# Patient Record
Sex: Female | Born: 1955 | Race: White | Hispanic: No | Marital: Married | Smoking: Former smoker
Health system: Southern US, Community
[De-identification: ages and names within clinical notes are randomized; demographics above are authoritative.]

## PROBLEM LIST (undated history)

## (undated) DIAGNOSIS — U071 COVID-19: Secondary | ICD-10-CM

## (undated) DIAGNOSIS — J1282 Pneumonia due to coronavirus disease 2019: Secondary | ICD-10-CM

## (undated) DIAGNOSIS — F329 Major depressive disorder, single episode, unspecified: Secondary | ICD-10-CM

## (undated) DIAGNOSIS — J189 Pneumonia, unspecified organism: Secondary | ICD-10-CM

## (undated) DIAGNOSIS — G9341 Metabolic encephalopathy: Secondary | ICD-10-CM

## (undated) DIAGNOSIS — F32A Depression, unspecified: Secondary | ICD-10-CM

## (undated) DIAGNOSIS — J9621 Acute and chronic respiratory failure with hypoxia: Secondary | ICD-10-CM

## (undated) HISTORY — PX: APPENDECTOMY: SHX54

## (undated) HISTORY — PX: TONSILLECTOMY: SUR1361

## (undated) HISTORY — PX: ABDOMINAL HYSTERECTOMY: SHX81

---

## 2013-04-19 ENCOUNTER — Emergency Department (HOSPITAL_BASED_OUTPATIENT_CLINIC_OR_DEPARTMENT_OTHER): Payer: BC Managed Care – PPO

## 2013-04-19 ENCOUNTER — Emergency Department (HOSPITAL_BASED_OUTPATIENT_CLINIC_OR_DEPARTMENT_OTHER)
Admission: EM | Admit: 2013-04-19 | Discharge: 2013-04-19 | Disposition: A | Discharge status: Home / Self Care | Payer: BC Managed Care – PPO | Attending: Emergency Medicine | Admitting: Emergency Medicine

## 2013-04-19 ENCOUNTER — Encounter (HOSPITAL_BASED_OUTPATIENT_CLINIC_OR_DEPARTMENT_OTHER): Payer: Self-pay | Admitting: Emergency Medicine

## 2013-04-19 DIAGNOSIS — F3289 Other specified depressive episodes: Secondary | ICD-10-CM | POA: Insufficient documentation

## 2013-04-19 DIAGNOSIS — F329 Major depressive disorder, single episode, unspecified: Secondary | ICD-10-CM | POA: Insufficient documentation

## 2013-04-19 DIAGNOSIS — R110 Nausea: Secondary | ICD-10-CM | POA: Insufficient documentation

## 2013-04-19 DIAGNOSIS — R0789 Other chest pain: Secondary | ICD-10-CM | POA: Insufficient documentation

## 2013-04-19 DIAGNOSIS — M79609 Pain in unspecified limb: Secondary | ICD-10-CM | POA: Insufficient documentation

## 2013-04-19 DIAGNOSIS — Z87891 Personal history of nicotine dependence: Secondary | ICD-10-CM | POA: Insufficient documentation

## 2013-04-19 DIAGNOSIS — Z79899 Other long term (current) drug therapy: Secondary | ICD-10-CM | POA: Insufficient documentation

## 2013-04-19 HISTORY — DX: Major depressive disorder, single episode, unspecified: F32.9

## 2013-04-19 HISTORY — DX: Depression, unspecified: F32.A

## 2013-04-19 LAB — TROPONIN I: Troponin I: <0.3 ng/mL (ref <0.30)

## 2013-04-19 LAB — CBC WITH DIFFERENTIAL/PLATELET
Basophils Absolute: 0 10*3/uL (ref 0.0–0.1)
Basophils Relative: 0 % (ref 0–1)
Eosinophils Absolute: 0.1 10*3/uL (ref 0.0–0.7)
Eosinophils Relative: 1 % (ref 0–5)
HCT: 43.6 % (ref 36.0–46.0)
HEMOGLOBIN: 14.5 g/dL (ref 12.0–15.0)
LYMPHS ABS: 1.9 10*3/uL (ref 0.7–4.0)
Lymphocytes Relative: 27 % (ref 12–46)
MCH: 30.8 pg (ref 26.0–34.0)
MCHC: 33.3 g/dL (ref 30.0–36.0)
MCV: 92.6 fL (ref 78.0–100.0)
MONOS PCT: 9 % (ref 3–12)
Monocytes Absolute: 0.6 10*3/uL (ref 0.1–1.0)
NEUTROS PCT: 63 % (ref 43–77)
Neutro Abs: 4.4 10*3/uL (ref 1.7–7.7)
Platelets: 200 10*3/uL (ref 150–400)
RBC: 4.71 MIL/uL (ref 3.87–5.11)
RDW: 13.6 % (ref 11.5–15.5)
WBC: 7 10*3/uL (ref 4.0–10.5)

## 2013-04-19 LAB — COMPREHENSIVE METABOLIC PANEL
ALK PHOS: 108 U/L (ref 39–117)
ALT: 49 U/L — ABNORMAL HIGH (ref 0–35)
AST: 35 U/L (ref 0–37)
Albumin: 4.3 g/dL (ref 3.5–5.2)
BILIRUBIN TOTAL: 0.3 mg/dL (ref 0.3–1.2)
BUN: 17 mg/dL (ref 6–23)
CHLORIDE: 97 meq/L (ref 96–112)
CO2: 27 meq/L (ref 19–32)
Calcium: 10.9 mg/dL — ABNORMAL HIGH (ref 8.4–10.5)
Creatinine, Ser: 0.8 mg/dL (ref 0.50–1.10)
GFR, EST NON AFRICAN AMERICAN: 80 mL/min — AB (ref >90)
GLUCOSE: 84 mg/dL (ref 70–99)
POTASSIUM: 3.9 meq/L (ref 3.7–5.3)
Sodium: 141 mEq/L (ref 137–147)
Total Protein: 7.9 g/dL (ref 6.0–8.3)

## 2013-04-19 MED ORDER — SODIUM CHLORIDE 0.9 % IV SOLN
INTRAVENOUS | Status: DC
  Administered 2013-04-19: 10:00:00 via INTRAVENOUS

## 2013-04-19 NOTE — Discharge Instructions (Signed)
Followup with your Dr. as already scheduled. Go to your nearest hospital should he develop chest pain with associated shortness of breath, sweating, or any other problems. Chest Pain (Nonspecific) It is often hard to give a specific diagnosis for the cause of chest pain. There is always a chance that your pain could be related to something serious, such as a heart attack or a blood clot in the lungs. You need to follow up with your caregiver for further evaluation. CAUSES   Heartburn.  Pneumonia or bronchitis.  Anxiety or stress.  Inflammation around your heart (pericarditis) or lung (pleuritis or pleurisy).  A blood clot in the lung.  A collapsed lung (pneumothorax). It can develop suddenly on its own (spontaneous pneumothorax) or from injury (trauma) to the chest.  Shingles infection (herpes zoster virus). The chest wall is composed of bones, muscles, and cartilage. Any of these can be the source of the pain.  The bones can be bruised by injury.  The muscles or cartilage can be strained by coughing or overwork.  The cartilage can be affected by inflammation and become sore (costochondritis). DIAGNOSIS  Lab tests or other studies, such as X-rays, electrocardiography, stress testing, or cardiac imaging, may be needed to find the cause of your pain.  TREATMENT   Treatment depends on what may be causing your chest pain. Treatment may include:  Acid blockers for heartburn.  Anti-inflammatory medicine.  Pain medicine for inflammatory conditions.  Antibiotics if an infection is present.  You may be advised to change lifestyle habits. This includes stopping smoking and avoiding alcohol, caffeine, and chocolate.  You may be advised to keep your head raised (elevated) when sleeping. This reduces the chance of acid going backward from your stomach into your esophagus.  Most of the time, nonspecific chest pain will improve within 2 to 3 days with rest and mild pain medicine. HOME  CARE INSTRUCTIONS   If antibiotics were prescribed, take your antibiotics as directed. Finish them even if you start to feel better.  For the next few days, avoid physical activities that bring on chest pain. Continue physical activities as directed.  Do not smoke.  Avoid drinking alcohol.  Only take over-the-counter or prescription medicine for pain, discomfort, or fever as directed by your caregiver.  Follow your caregiver's suggestions for further testing if your chest pain does not go away.  Keep any follow-up appointments you made. If you do not go to an appointment, you could develop lasting (chronic) problems with pain. If there is any problem keeping an appointment, you must call to reschedule. SEEK MEDICAL CARE IF:   You think you are having problems from the medicine you are taking. Read your medicine instructions carefully.  Your chest pain does not go away, even after treatment.  You develop a rash with blisters on your chest. SEEK IMMEDIATE MEDICAL CARE IF:   You have increased chest pain or pain that spreads to your arm, neck, jaw, back, or abdomen.  You develop shortness of breath, an increasing cough, or you are coughing up blood.  You have severe back or abdominal pain, feel nauseous, or vomit.  You develop severe weakness, fainting, or chills.  You have a fever. THIS IS AN EMERGENCY. Do not wait to see if the pain will go away. Get medical help at once. Call your local emergency services (911 in [place] Do not drive yourself to the hospital. MAKE SURE YOU:   Understand these instructions.  Will watch your condition.  Will  get help right away if you are not doing well or get worse. Document Released: 10/20/2004 Document Revised: 04/04/2011 Document Reviewed: 08/16/2007 [facility] Patient Information 2014 [place].

## 2013-04-19 NOTE — ED Notes (Signed)
MD at bedside discussing test results and dispo plan of care. 

## 2013-04-19 NOTE — ED Provider Notes (Signed)
CSN: [id number]     Arrival date & time 04/19/13  7829 History   First MD Initiated Contact with Patient 04/19/13 0957     Chief Complaint  Patient presents with  . Chest Pain     (Consider location/radiation/quality/duration/timing/severity/associated sxs/prior Treatment) Patient is a 58 y.o. female presenting with chest pain. The history is provided by the patient.  Chest Pain  patient here complaining of left arm pain that started one hour prior to arrival while she was at work sitting down. Pain radiates to left side of her chest. No associated shortness of breath or diaphoresis. Some nausea noted. No syncope or near syncope. No pleuritic symptoms. Symptoms lasted for approximately one hour and she is pain-free. She did take aspirin. No prior history of same. Does feel back to her baseline at this time.  Past Medical History  Diagnosis Date  . Depression    Past Surgical History  Procedure Laterality Date  . Abdominal hysterectomy    . Appendectomy    . Tonsillectomy     No family history on file. History  Substance Use Topics  . Smoking status: Former Smoker    Quit date: 03/23/2011  . Smokeless tobacco: Not on file  . Alcohol Use: Yes     Comment: occasional   OB History   Grav Para Term Preterm Abortions TAB SAB Ect Mult Living                 Review of Systems  Cardiovascular: Positive for chest pain.  All other systems reviewed and are negative.      Allergies  Review of patient's allergies indicates no known allergies.  Home Medications   Current Outpatient Rx  Name  Route  Sig  Dispense  Refill  . escitalopram (LEXAPRO) 20 MG tablet   Oral   Take 20 mg by mouth daily.          BP 166/88  Pulse 74  Temp(Src) 98.2 F (36.8 C) (Oral)  Resp 18  Ht 5\' 2"  (1.575 m)  Wt 180 lb (81.647 kg)  BMI 32.91 kg/m2  SpO2 97% Physical Exam  Nursing note and vitals reviewed. Constitutional: She is oriented to person, place, and time. She appears  well-developed and well-nourished.  Non-toxic appearance. No distress.  HENT:  Head: Normocephalic and atraumatic.  Eyes: Conjunctivae, EOM and lids are normal. Pupils are equal, round, and reactive to light.  Neck: Normal range of motion. Neck supple. No tracheal deviation present. No mass present.  Cardiovascular: Normal rate, regular rhythm and normal heart sounds.  Exam reveals no gallop.   No murmur heard. Pulmonary/Chest: Effort normal and breath sounds normal. No stridor. No respiratory distress. She has no decreased breath sounds. She has no wheezes. She has no rhonchi. She has no rales.  Abdominal: Soft. Normal appearance and bowel sounds are normal. She exhibits no distension. There is no tenderness. There is no rebound and no CVA tenderness.  Musculoskeletal: Normal range of motion. She exhibits no edema and no tenderness.  Neurological: She is alert and oriented to person, place, and time. She has normal strength. No cranial nerve deficit or sensory deficit. GCS eye subscore is 4. GCS verbal subscore is 5. GCS motor subscore is 6.  Skin: Skin is warm and dry. No abrasion and no rash noted.  Psychiatric: She has a normal mood and affect. Her speech is normal and behavior is normal.    ED Course  Procedures (including critical care time) Labs Review Labs  Reviewed  CBC WITH DIFFERENTIAL  COMPREHENSIVE METABOLIC PANEL  TROPONIN I  TROPONIN I   Imaging Review No results found.   EKG Interpretation   Date/Time:  Friday April 19 2013 09:58:53 EDT Ventricular Rate:  78 PR Interval:  164 QRS Duration: 94 QT Interval:  398 QTC Calculation: 453 R Axis:   78 Text Interpretation:  Normal sinus rhythm Normal ECG Confirmed by Norene Oliveri   MD, Tanecia Mccay ([id number]) on 04/19/2013 10:15:34 AM      MDM   Final diagnoses:  None    Patient negative cardiac markers x2. Given the atypical nature of her symptoms I do not think that this represents ACS. She had no associated symptoms such as  dyspnea or diaphoresis. Patient called the primary care Dr. and was given strict return precautions.    Toy BakerAnthony T Shalva Rozycki, MD 04/19/13 [phone]

## 2013-04-19 NOTE — ED Notes (Signed)
Pt reports left chest wall pain radiating to left arm that started PTA while at work. She took 650mg  ASA PTA.

## 2019-10-03 ENCOUNTER — Inpatient Hospital Stay
Admission: AD | Admit: 2019-10-03 | Discharge: 2019-11-01 | Disposition: A | Discharge status: Rehabilitation Facility | Payer: 59 | Source: Other Acute Inpatient Hospital | Attending: Internal Medicine | Admitting: Internal Medicine

## 2019-10-03 ENCOUNTER — Other Ambulatory Visit (HOSPITAL_COMMUNITY): Payer: 59

## 2019-10-03 ENCOUNTER — Ambulatory Visit (HOSPITAL_COMMUNITY)
Admission: AD | Admit: 2019-10-03 | Discharge: 2019-10-03 | Disposition: A | Discharge status: Home / Self Care | Payer: Self-pay | Source: Other Acute Inpatient Hospital | Attending: Internal Medicine | Admitting: Internal Medicine

## 2019-10-03 DIAGNOSIS — J9621 Acute and chronic respiratory failure with hypoxia: Secondary | ICD-10-CM | POA: Diagnosis present

## 2019-10-03 DIAGNOSIS — U071 COVID-19: Secondary | ICD-10-CM | POA: Diagnosis present

## 2019-10-03 DIAGNOSIS — Z931 Gastrostomy status: Secondary | ICD-10-CM

## 2019-10-03 DIAGNOSIS — J189 Pneumonia, unspecified organism: Secondary | ICD-10-CM

## 2019-10-03 DIAGNOSIS — J1282 Pneumonia due to coronavirus disease 2019: Secondary | ICD-10-CM | POA: Diagnosis present

## 2019-10-03 DIAGNOSIS — J9690 Respiratory failure, unspecified, unspecified whether with hypoxia or hypercapnia: Secondary | ICD-10-CM | POA: Insufficient documentation

## 2019-10-03 DIAGNOSIS — G9341 Metabolic encephalopathy: Secondary | ICD-10-CM | POA: Diagnosis present

## 2019-10-03 DIAGNOSIS — Z930 Tracheostomy status: Secondary | ICD-10-CM

## 2019-10-03 HISTORY — DX: Pneumonia, unspecified organism: J18.9

## 2019-10-03 HISTORY — DX: COVID-19: U07.1

## 2019-10-03 HISTORY — DX: Pneumonia due to coronavirus disease 2019: J12.82

## 2019-10-03 HISTORY — DX: Acute and chronic respiratory failure with hypoxia: J96.21

## 2019-10-03 HISTORY — DX: Metabolic encephalopathy: G93.41

## 2019-10-03 LAB — BLOOD GAS, ARTERIAL
Acid-Base Excess: 4.1 mmol/L — ABNORMAL HIGH (ref 0.0–2.0)
Bicarbonate: 28.6 mmol/L — ABNORMAL HIGH (ref 20.0–28.0)
FIO2: 40
O2 Saturation: 99.5 %
Patient temperature: 37.9
pCO2 arterial: 49 mmHg — ABNORMAL HIGH (ref 32.0–48.0)
pH, Arterial: 7.388 (ref 7.350–7.450)
pO2, Arterial: 152 mmHg — ABNORMAL HIGH (ref 83.0–108.0)

## 2019-10-03 MED ORDER — IOHEXOL 300 MG/ML  SOLN
50.0000 mL | Freq: Once | INTRAMUSCULAR | Status: AC | PRN
  Administered 2019-10-03: 50 mL

## 2019-10-04 ENCOUNTER — Other Ambulatory Visit (HOSPITAL_COMMUNITY): Payer: 59

## 2019-10-04 ENCOUNTER — Encounter: Payer: Self-pay | Admitting: Internal Medicine

## 2019-10-04 DIAGNOSIS — J9621 Acute and chronic respiratory failure with hypoxia: Secondary | ICD-10-CM | POA: Diagnosis not present

## 2019-10-04 DIAGNOSIS — U071 COVID-19: Secondary | ICD-10-CM | POA: Diagnosis not present

## 2019-10-04 DIAGNOSIS — G9341 Metabolic encephalopathy: Secondary | ICD-10-CM | POA: Diagnosis not present

## 2019-10-04 DIAGNOSIS — J189 Pneumonia, unspecified organism: Secondary | ICD-10-CM | POA: Diagnosis not present

## 2019-10-04 DIAGNOSIS — J1282 Pneumonia due to coronavirus disease 2019: Secondary | ICD-10-CM

## 2019-10-04 LAB — BLOOD GAS, ARTERIAL
Acid-Base Excess: 5.3 mmol/L — ABNORMAL HIGH (ref 0.0–2.0)
Bicarbonate: 29.7 mmol/L — ABNORMAL HIGH (ref 20.0–28.0)
FIO2: 40
O2 Saturation: 99.4 %
Patient temperature: 36.6
pCO2 arterial: 45.9 mmHg (ref 32.0–48.0)
pH, Arterial: 7.424 (ref 7.350–7.450)
pO2, Arterial: 150 mmHg — ABNORMAL HIGH (ref 83.0–108.0)

## 2019-10-04 LAB — CBC WITH DIFFERENTIAL/PLATELET
Abs Immature Granulocytes: 0.24 10*3/uL — ABNORMAL HIGH (ref 0.00–0.07)
Basophils Absolute: 0 10*3/uL (ref 0.0–0.1)
Basophils Relative: 0 %
Eosinophils Absolute: 0.1 10*3/uL (ref 0.0–0.5)
Eosinophils Relative: 1 %
HCT: 27.7 % — ABNORMAL LOW (ref 36.0–46.0)
Hemoglobin: 8.2 g/dL — ABNORMAL LOW (ref 12.0–15.0)
Immature Granulocytes: 2 %
Lymphocytes Relative: 9 %
Lymphs Abs: 1 10*3/uL (ref 0.7–4.0)
MCH: 29.8 pg (ref 26.0–34.0)
MCHC: 29.6 g/dL — ABNORMAL LOW (ref 30.0–36.0)
MCV: 100.7 fL — ABNORMAL HIGH (ref 80.0–100.0)
Monocytes Absolute: 0.7 10*3/uL (ref 0.1–1.0)
Monocytes Relative: 6 %
Neutro Abs: 9.7 10*3/uL — ABNORMAL HIGH (ref 1.7–7.7)
Neutrophils Relative %: 82 %
Platelets: 296 10*3/uL (ref 150–400)
RBC: 2.75 MIL/uL — ABNORMAL LOW (ref 3.87–5.11)
RDW: 18.5 % — ABNORMAL HIGH (ref 11.5–15.5)
WBC: 11.7 10*3/uL — ABNORMAL HIGH (ref 4.0–10.5)
nRBC: 0.3 % — ABNORMAL HIGH (ref 0.0–0.2)

## 2019-10-04 LAB — COMPREHENSIVE METABOLIC PANEL
ALT: 134 U/L — ABNORMAL HIGH (ref 0–44)
AST: 72 U/L — ABNORMAL HIGH (ref 15–41)
Albumin: 2.6 g/dL — ABNORMAL LOW (ref 3.5–5.0)
Alkaline Phosphatase: 171 U/L — ABNORMAL HIGH (ref 38–126)
Anion gap: 11 (ref 5–15)
BUN: 73 mg/dL — ABNORMAL HIGH (ref 8–23)
CO2: 29 mmol/L (ref 22–32)
Calcium: 9.2 mg/dL (ref 8.9–10.3)
Chloride: 116 mmol/L — ABNORMAL HIGH (ref 98–111)
Creatinine, Ser: 0.88 mg/dL (ref 0.44–1.00)
GFR calc Af Amer: >60 mL/min (ref >60)
GFR calc non Af Amer: >60 mL/min (ref >60)
Glucose, Bld: 126 mg/dL — ABNORMAL HIGH (ref 70–99)
Potassium: 3.3 mmol/L — ABNORMAL LOW (ref 3.5–5.1)
Sodium: 156 mmol/L — ABNORMAL HIGH (ref 135–145)
Total Bilirubin: 0.6 mg/dL (ref 0.3–1.2)
Total Protein: 6.7 g/dL (ref 6.5–8.1)

## 2019-10-04 LAB — MAGNESIUM: Magnesium: 2.2 mg/dL (ref 1.7–2.4)

## 2019-10-04 LAB — PROTIME-INR
INR: 1.2 (ref 0.8–1.2)
Prothrombin Time: 15 seconds (ref 11.4–15.2)

## 2019-10-04 LAB — PHOSPHORUS: Phosphorus: 2.8 mg/dL (ref 2.5–4.6)

## 2019-10-04 NOTE — Consult Note (Signed)
Pulmonary Critical Care Medicine [facility] [place]  PULMONARY SERVICE  Date of Service: 10/04/2019  PULMONARY CRITICAL CARE CONSULT   Kerianna Rawlinson  ENM:[id number]  DOB: [birth year]   DOA: 10/03/2019  Referring Physician: Carron Curie, MD  HPI: Jill Navarro is a 64 y.o. female seen for follow up of Acute on Chronic Respiratory Failure.  Patient has multiple medical problems including morbid obesity depression anxiety who presented to the hospital because of increased weakness nausea vomiting.  Patient has been having more shortness of breath saturations were noted to be in the 80s when presented to the hospital in the emergency department.  Patient was diagnosed with COVID-19 had not been vaccinated.  The patient initially was started on OptiFlow and was having quite a bit of anxiety as well as severe desaturation.  Patient was self proning however patient continued to get worse by 16 August was intubated and placed on paralysis.  Initially the FiO2 was 70% with a 12 of PEEP and subsequently was gradually weaned down to 40%.  Paralytics were eventually weaned off.  Patient was attempted at weaning however did not and subsequently had a tracheostomy and a PEG tube placed.  Patient is transferred now to our facility for further management evaluation has been on pressure support 12/5 with good volumes and appears to be tolerating that better now.  She still has a lot of issues with anxiety and currently has been on propofol for sedation which we are going to try to wean off.  Review of Systems:  ROS performed and is unremarkable other than noted above.  Past Medical History:  Diagnosis Date   Depression     Past Surgical History:  Procedure Laterality Date   ABDOMINAL HYSTERECTOMY     APPENDECTOMY     TONSILLECTOMY      Social History:    reports that she quit smoking about 8 years ago. She does not have any smokeless tobacco history on file. She reports current alcohol use.  She reports that she does not use drugs.  Family History: Non-Contributory to the present illness  No Known Allergies  Medications: Reviewed on Rounds  Physical Exam:  Vitals: Temperature 98.7 pulse 99 respiratory rate 25 blood pressure 174/86 saturations 100%  Ventilator Settings on pressure support FiO2 is 40% pressure 12/5 tidal volume 600   General: Comfortable at this time  Eyes: Grossly normal lids, irises & conjunctiva  ENT: grossly tongue is normal  Neck: no obvious mass  Cardiovascular: S1-S2 normal no gallop or rub  Respiratory: Scattered rhonchi expansion equal  Abdomen: Soft and nontender  Skin: no rash seen on limited exam  Musculoskeletal: not rigid  Psychiatric:unable to assess  Neurologic: no seizure no involuntary movements         Labs on Admission:  Basic Metabolic Panel: Recent Labs  Lab 10/04/19 0731  NA 156*  K 3.3*  CL 116*  CO2 29  GLUCOSE 126*  BUN 73*  CREATININE 0.88  CALCIUM 9.2  MG 2.2  PHOS 2.8    Recent Labs  Lab 10/03/19 2145  PHART 7.388  PCO2ART 49.0*  PO2ART 152*  HCO3 28.6*  O2SAT 99.5    Liver Function Tests: Recent Labs  Lab 10/04/19 0731  AST 72*  ALT 134*  ALKPHOS 171*  BILITOT 0.6  PROT 6.7  ALBUMIN 2.6*   No results for input(s): LIPASE, AMYLASE in the last 168 hours. No results for input(s): AMMONIA in the last 168 hours.  CBC: Recent Labs  Lab  10/04/19 0731  WBC 11.7*  NEUTROABS 9.7*  HGB 8.2*  HCT 27.7*  MCV 100.7*  PLT 296    Cardiac Enzymes: No results for input(s): CKTOTAL, CKMB, CKMBINDEX, TROPONINI in the last 168 hours.  BNP (last 3 results) No results for input(s): BNP in the last 8760 hours.  ProBNP (last 3 results) No results for input(s): PROBNP in the last 8760 hours.   Radiological Exams on Admission: DG ABDOMEN PEG TUBE LOCATION  Result Date: 10/03/2019 CLINICAL DATA:  Peg tube. EXAM: ABDOMEN - 1 VIEW COMPARISON:  09/09/2019 FINDINGS: Injection of 40 mL of  Omnipaque 300 through the pre-existing gastrostomy tube opacifies the stomach. The tube appears to be grossly well position. No extraluminal contrast is noted. The bowel gas pattern is nonobstructive. Grossly well-positioned percutaneous gastrostomy tube. Electronically Signed   By: Katherine Mantle M.D.   On: 10/03/2019 21:54    Assessment/Plan Active Problems:   Acute on chronic respiratory failure with hypoxia (HCC)   COVID-19 virus infection   Pneumonia due to COVID-19 virus   Healthcare-associated pneumonia   Metabolic encephalopathy   1. Acute chronic respiratory failure with hypoxia patient right now is on pressure support has actually been tolerating it quite well currently pressures are 12/5 with good volumes are noted.  Plan will be to continue to extend the duration of pressure support and expand on the weaning protocol.  Patient is on sedation need to wean off the propofol and advance the weaning once patient is more awake 2. COVID-19 virus infection in recovery phase we will continue with supportive care monitor oxygen requirements. 3. COVID-19 pneumonia follow-up x-rays was ordered.  We will continue to monitor closely. 4. Metabolic encephalopathy slow improvement probably secondary to Covid infection. 5. Healthcare associated pneumonia as a secondary effect of initial Covid infection.  We will continue to follow to resolution x-ray ordered  I have personally seen and evaluated the patient, evaluated laboratory and imaging results, formulated the assessment and plan and placed orders. The Patient requires high complexity decision making with multiple systems involvement.  Case was discussed on Rounds with the Respiratory Therapy Director and the Respiratory staff Time Spent  Yevonne Pax, MD [facility] Pulmonary Critical Care Medicine Sleep Medicine

## 2019-10-05 DIAGNOSIS — G9341 Metabolic encephalopathy: Secondary | ICD-10-CM | POA: Diagnosis not present

## 2019-10-05 DIAGNOSIS — J189 Pneumonia, unspecified organism: Secondary | ICD-10-CM | POA: Diagnosis not present

## 2019-10-05 DIAGNOSIS — J9621 Acute and chronic respiratory failure with hypoxia: Secondary | ICD-10-CM | POA: Diagnosis not present

## 2019-10-05 DIAGNOSIS — U071 COVID-19: Secondary | ICD-10-CM | POA: Diagnosis not present

## 2019-10-05 LAB — URINALYSIS, ROUTINE W REFLEX MICROSCOPIC
Bilirubin Urine: NEGATIVE
Glucose, UA: NEGATIVE mg/dL
Hgb urine dipstick: NEGATIVE
Ketones, ur: NEGATIVE mg/dL
Leukocytes,Ua: NEGATIVE
Nitrite: NEGATIVE
Protein, ur: 30 mg/dL — AB
Specific Gravity, Urine: 1.013 (ref 1.005–1.030)
pH: 5 (ref 5.0–8.0)

## 2019-10-05 LAB — BASIC METABOLIC PANEL
Anion gap: 11 (ref 5–15)
BUN: 58 mg/dL — ABNORMAL HIGH (ref 8–23)
CO2: 28 mmol/L (ref 22–32)
Calcium: 8.7 mg/dL — ABNORMAL LOW (ref 8.9–10.3)
Chloride: 114 mmol/L — ABNORMAL HIGH (ref 98–111)
Creatinine, Ser: 0.85 mg/dL (ref 0.44–1.00)
GFR calc Af Amer: >60 mL/min (ref >60)
GFR calc non Af Amer: >60 mL/min (ref >60)
Glucose, Bld: 135 mg/dL — ABNORMAL HIGH (ref 70–99)
Potassium: 3.4 mmol/L — ABNORMAL LOW (ref 3.5–5.1)
Sodium: 153 mmol/L — ABNORMAL HIGH (ref 135–145)

## 2019-10-05 NOTE — Progress Notes (Signed)
Pulmonary Critical Care Medicine [facility] [place]   PULMONARY CRITICAL CARE SERVICE  PROGRESS NOTE  Date of Service: 10/05/2019  Jill Navarro  QZE:[id number]  DOB: 06-Sep-1955   DOA: 10/03/2019  Referring Physician: Carron Curie, MD  HPI: Jill Navarro is a 64 y.o. female seen for follow up of Acute on Chronic Respiratory Failure.  Patient at this time is on T collar has been on 30% FiO2 good saturations are noted.  The goal today is for 2 hours  Medications: Reviewed on Rounds  Physical Exam:  Vitals: Temperature is 99.1 pulse 98 respiratory rate 35 blood pressure is 147/72 saturations 92%  Ventilator Settings on T collar FiO2 30%  . General: Comfortable at this time . Eyes: Grossly normal lids, irises & conjunctiva . ENT: grossly tongue is normal . Neck: no obvious mass . Cardiovascular: S1 S2 normal no gallop . Respiratory: Good air entry no rhonchi . Abdomen: soft . Skin: no rash seen on limited exam . Musculoskeletal: not rigid . Psychiatric:unable to assess . Neurologic: no seizure no involuntary movements         Lab Data:   Basic Metabolic Panel: Recent Labs  Lab 10/04/19 0731 10/05/19 0742  NA 156* 153*  K 3.3* 3.4*  CL 116* 114*  CO2 29 28  GLUCOSE 126* 135*  BUN 73* 58*  CREATININE 0.88 0.85  CALCIUM 9.2 8.7*  MG 2.2  --   PHOS 2.8  --     ABG: Recent Labs  Lab 10/03/19 2145 10/04/19 1755  PHART 7.388 7.424  PCO2ART 49.0* 45.9  PO2ART 152* 150*  HCO3 28.6* 29.7*  O2SAT 99.5 99.4    Liver Function Tests: Recent Labs  Lab 10/04/19 0731  AST 72*  ALT 134*  ALKPHOS 171*  BILITOT 0.6  PROT 6.7  ALBUMIN 2.6*   No results for input(s): LIPASE, AMYLASE in the last 168 hours. No results for input(s): AMMONIA in the last 168 hours.  CBC: Recent Labs  Lab 10/04/19 0731  WBC 11.7*  NEUTROABS 9.7*  HGB 8.2*  HCT 27.7*  MCV 100.7*  PLT 296    Cardiac Enzymes: No results for input(s): CKTOTAL, CKMB, CKMBINDEX,  TROPONINI in the last 168 hours.  BNP (last 3 results) No results for input(s): BNP in the last 8760 hours.  ProBNP (last 3 results) No results for input(s): PROBNP in the last 8760 hours.  Radiological Exams: DG ABDOMEN PEG TUBE LOCATION  Result Date: 10/03/2019 CLINICAL DATA:  Peg tube. EXAM: ABDOMEN - 1 VIEW COMPARISON:  09/09/2019 FINDINGS: Injection of 40 mL of Omnipaque 300 through the pre-existing gastrostomy tube opacifies the stomach. The tube appears to be grossly well position. No extraluminal contrast is noted. The bowel gas pattern is nonobstructive. Grossly well-positioned percutaneous gastrostomy tube. Electronically Signed   By: Katherine Mantle M.D.   On: 10/03/2019 21:54   DG CHEST PORT 1 VIEW  Result Date: 10/04/2019 CLINICAL DATA:  Respiratory failure EXAM: PORTABLE CHEST 1 VIEW COMPARISON:  October 02, 2019 FINDINGS: Tracheostomy catheter tip is 4.9 cm above the carina. Central catheter tip is in the superior vena cava near the cavoatrial junction, stable. No pneumothorax. Widespread patchy interstitial and airspace opacity is present with slightly less airspace opacity bilaterally compared to 2 days prior. No new opacity evident. Heart size and pulmonary vascularity normal. No adenopathy. No bone lesions. IMPRESSION: Tube and catheter positions as described without pneumothorax. Multifocal interstitial and airspace opacity present with overall less opacity bilaterally compared to 2 days prior.  No new opacities are evident. Stable cardiac silhouette. Electronically Signed   By: Bretta Bang III M.D.   On: 10/04/2019 09:31    Assessment/Plan Active Problems:   Acute on chronic respiratory failure with hypoxia (HCC)   COVID-19 virus infection   Pneumonia due to COVID-19 virus   Healthcare-associated pneumonia   Metabolic encephalopathy   1. Acute on chronic respiratory failure with hypoxia patient is weaning on T collar currently the goal is for 2 hours we will  continue to advance the weaning on T collar as tolerated titrate oxygen down as tolerated. 2. COVID-19 virus infection in resolution phase we will continue to monitor closely 3. pneumonia due to COVID-19 supportive care x-rays not showing any new changes 4. Healthcare associated pneumonia treated with antibiotics we will continue to monitor. 5. Metabolic encephalopathy slow to improve   I have personally seen and evaluated the patient, evaluated laboratory and imaging results, formulated the assessment and plan and placed orders. The Patient requires high complexity decision making with multiple systems involvement.  Rounds were done with the Respiratory Therapy Director and Staff therapists and discussed with nursing staff also.  Yevonne Pax, MD [facility] Pulmonary Critical Care Medicine Sleep Medicine

## 2019-10-06 DIAGNOSIS — U071 COVID-19: Secondary | ICD-10-CM | POA: Diagnosis not present

## 2019-10-06 DIAGNOSIS — J9621 Acute and chronic respiratory failure with hypoxia: Secondary | ICD-10-CM | POA: Diagnosis not present

## 2019-10-06 DIAGNOSIS — J189 Pneumonia, unspecified organism: Secondary | ICD-10-CM | POA: Diagnosis not present

## 2019-10-06 DIAGNOSIS — G9341 Metabolic encephalopathy: Secondary | ICD-10-CM | POA: Diagnosis not present

## 2019-10-06 LAB — BASIC METABOLIC PANEL
Anion gap: 11 (ref 5–15)
BUN: 46 mg/dL — ABNORMAL HIGH (ref 8–23)
CO2: 28 mmol/L (ref 22–32)
Calcium: 8.4 mg/dL — ABNORMAL LOW (ref 8.9–10.3)
Chloride: 110 mmol/L (ref 98–111)
Creatinine, Ser: 0.8 mg/dL (ref 0.44–1.00)
GFR calc Af Amer: >60 mL/min (ref >60)
GFR calc non Af Amer: >60 mL/min (ref >60)
Glucose, Bld: 143 mg/dL — ABNORMAL HIGH (ref 70–99)
Potassium: 4.2 mmol/L (ref 3.5–5.1)
Sodium: 149 mmol/L — ABNORMAL HIGH (ref 135–145)

## 2019-10-06 LAB — URINE CULTURE: Culture: NO GROWTH

## 2019-10-06 LAB — CULTURE, RESPIRATORY W GRAM STAIN: Culture: NORMAL

## 2019-10-06 NOTE — Progress Notes (Signed)
Pulmonary Critical Care Medicine [facility] [place]   PULMONARY CRITICAL CARE SERVICE  PROGRESS NOTE  Date of Service: 10/06/2019  Kaitrin Seybold  BSJ:[id number]  DOB: Sep 30, 1955   DOA: 10/03/2019  Referring Physician: Carron Curie, MD  HPI: Jill Navarro is a 64 y.o. female seen for follow up of Acute on Chronic Respiratory Failure.  Patient is on the ventilator right now on full support on assist control mode currently is requiring 40% FiO2  Medications: Reviewed on Rounds  Physical Exam:  Vitals: Temperature is 100.3 pulse 104 respiratory rate 30 blood pressure is 165/83 saturations 96%  Ventilator Settings on assist control FiO2 40% tidal volume 400 PEEP 5   General: Comfortable at this time  Eyes: Grossly normal lids, irises & conjunctiva  ENT: grossly tongue is normal  Neck: no obvious mass  Cardiovascular: S1 S2 normal no gallop  Respiratory: No rhonchi coarse breath sounds  Abdomen: soft  Skin: no rash seen on limited exam  Musculoskeletal: not rigid  Psychiatric:unable to assess  Neurologic: no seizure no involuntary movements         Lab Data:   Basic Metabolic Panel: Recent Labs  Lab 10/04/19 0731 10/05/19 0742 10/06/19 0637  NA 156* 153* 149*  K 3.3* 3.4* 4.2  CL 116* 114* 110  CO2 29 28 28   GLUCOSE 126* 135* 143*  BUN 73* 58* 46*  CREATININE 0.88 0.85 0.80  CALCIUM 9.2 8.7* 8.4*  MG 2.2  --   --   PHOS 2.8  --   --     ABG: Recent Labs  Lab 10/03/19 2145 10/04/19 1755  PHART 7.388 7.424  PCO2ART 49.0* 45.9  PO2ART 152* 150*  HCO3 28.6* 29.7*  O2SAT 99.5 99.4    Liver Function Tests: Recent Labs  Lab 10/04/19 0731  AST 72*  ALT 134*  ALKPHOS 171*  BILITOT 0.6  PROT 6.7  ALBUMIN 2.6*   No results for input(s): LIPASE, AMYLASE in the last 168 hours. No results for input(s): AMMONIA in the last 168 hours.  CBC: Recent Labs  Lab 10/04/19 0731  WBC 11.7*  NEUTROABS 9.7*  HGB 8.2*  HCT 27.7*  MCV 100.7*   PLT 296    Cardiac Enzymes: No results for input(s): CKTOTAL, CKMB, CKMBINDEX, TROPONINI in the last 168 hours.  BNP (last 3 results) No results for input(s): BNP in the last 8760 hours.  ProBNP (last 3 results) No results for input(s): PROBNP in the last 8760 hours.  Radiological Exams: No results found.  Assessment/Plan Active Problems:   Acute on chronic respiratory failure with hypoxia (HCC)   COVID-19 virus infection   Pneumonia due to COVID-19 virus   Healthcare-associated pneumonia   Metabolic encephalopathy   1. Acute on chronic respiratory failure with hypoxia we will continue with full support on the vent patient right now is on 40% FiO2 the patient should be weaning on T collar today about 4 hours 2. COVID-19 virus infection in recovery phase 3. Pneumonia due to COVID-19 slow improvement 4. Healthcare associated pneumonia again improving slowly 5. Metabolic encephalopathy grossly unchanged we will continue to monitor   I have personally seen and evaluated the patient, evaluated laboratory and imaging results, formulated the assessment and plan and placed orders. The Patient requires high complexity decision making with multiple systems involvement.  Rounds were done with the Respiratory Therapy Director and Staff therapists and discussed with nursing staff also.  12/04/19, MD [facility] Pulmonary Critical Care Medicine Sleep Medicine

## 2019-10-07 DIAGNOSIS — U071 COVID-19: Secondary | ICD-10-CM | POA: Diagnosis not present

## 2019-10-07 DIAGNOSIS — J9621 Acute and chronic respiratory failure with hypoxia: Secondary | ICD-10-CM | POA: Diagnosis not present

## 2019-10-07 DIAGNOSIS — J189 Pneumonia, unspecified organism: Secondary | ICD-10-CM | POA: Diagnosis not present

## 2019-10-07 DIAGNOSIS — G9341 Metabolic encephalopathy: Secondary | ICD-10-CM | POA: Diagnosis not present

## 2019-10-07 LAB — BASIC METABOLIC PANEL
Anion gap: 11 (ref 5–15)
BUN: 47 mg/dL — ABNORMAL HIGH (ref 8–23)
CO2: 27 mmol/L (ref 22–32)
Calcium: 8.6 mg/dL — ABNORMAL LOW (ref 8.9–10.3)
Chloride: 104 mmol/L (ref 98–111)
Creatinine, Ser: 0.68 mg/dL (ref 0.44–1.00)
GFR calc Af Amer: >60 mL/min (ref >60)
GFR calc non Af Amer: >60 mL/min (ref >60)
Glucose, Bld: 144 mg/dL — ABNORMAL HIGH (ref 70–99)
Potassium: 4.3 mmol/L (ref 3.5–5.1)
Sodium: 142 mmol/L (ref 135–145)

## 2019-10-07 LAB — CBC
HCT: 29.1 % — ABNORMAL LOW (ref 36.0–46.0)
Hemoglobin: 8.6 g/dL — ABNORMAL LOW (ref 12.0–15.0)
MCH: 29 pg (ref 26.0–34.0)
MCHC: 29.6 g/dL — ABNORMAL LOW (ref 30.0–36.0)
MCV: 98 fL (ref 80.0–100.0)
Platelets: 234 10*3/uL (ref 150–400)
RBC: 2.97 MIL/uL — ABNORMAL LOW (ref 3.87–5.11)
RDW: 16.3 % — ABNORMAL HIGH (ref 11.5–15.5)
WBC: 13.6 10*3/uL — ABNORMAL HIGH (ref 4.0–10.5)
nRBC: 0.2 % (ref 0.0–0.2)

## 2019-10-07 LAB — LACTIC ACID, PLASMA: Lactic Acid, Venous: 1.9 mmol/L (ref 0.5–1.9)

## 2019-10-07 LAB — MAGNESIUM: Magnesium: 1.7 mg/dL (ref 1.7–2.4)

## 2019-10-07 NOTE — Progress Notes (Signed)
Pulmonary Critical Care Medicine [facility] [place]   PULMONARY CRITICAL CARE SERVICE  PROGRESS NOTE  Date of Service: 10/07/2019  Jill Navarro  JYN:[id number]  DOB: Feb 23, 1955   DOA: 10/03/2019  Referring Physician: Carron Curie, MD  HPI: Jill Navarro is a 64 y.o. female seen for follow up of Acute on Chronic Respiratory Failure.  Patient is on pressure support currently on 35% FiO2 pressure 12/5 ready for T collar  Medications: Reviewed on Rounds  Physical Exam:  Vitals: Temperature is 98.5 pulse 86 respiratory 29 blood pressure is 161/84 saturations 99%  Ventilator Settings on pressure support FiO2 35% pressure 12/5  . General: Comfortable at this time . Eyes: Grossly normal lids, irises & conjunctiva . ENT: grossly tongue is normal . Neck: no obvious mass . Cardiovascular: S1 S2 normal no gallop . Respiratory: No rhonchi no rales are noted at this time . Abdomen: soft . Skin: no rash seen on limited exam . Musculoskeletal: not rigid . Psychiatric:unable to assess . Neurologic: no seizure no involuntary movements         Lab Data:   Basic Metabolic Panel: Recent Labs  Lab 10/04/19 0731 10/05/19 0742 10/06/19 0637  NA 156* 153* 149*  K 3.3* 3.4* 4.2  CL 116* 114* 110  CO2 29 28 28   GLUCOSE 126* 135* 143*  BUN 73* 58* 46*  CREATININE 0.88 0.85 0.80  CALCIUM 9.2 8.7* 8.4*  MG 2.2  --   --   PHOS 2.8  --   --     ABG: Recent Labs  Lab 10/03/19 2145 10/04/19 1755  PHART 7.388 7.424  PCO2ART 49.0* 45.9  PO2ART 152* 150*  HCO3 28.6* 29.7*  O2SAT 99.5 99.4    Liver Function Tests: Recent Labs  Lab 10/04/19 0731  AST 72*  ALT 134*  ALKPHOS 171*  BILITOT 0.6  PROT 6.7  ALBUMIN 2.6*   No results for input(s): LIPASE, AMYLASE in the last 168 hours. No results for input(s): AMMONIA in the last 168 hours.  CBC: Recent Labs  Lab 10/04/19 0731  WBC 11.7*  NEUTROABS 9.7*  HGB 8.2*  HCT 27.7*  MCV 100.7*  PLT 296    Cardiac  Enzymes: No results for input(s): CKTOTAL, CKMB, CKMBINDEX, TROPONINI in the last 168 hours.  BNP (last 3 results) No results for input(s): BNP in the last 8760 hours.  ProBNP (last 3 results) No results for input(s): PROBNP in the last 8760 hours.  Radiological Exams: No results found.  Assessment/Plan Active Problems:   Acute on chronic respiratory failure with hypoxia (HCC)   COVID-19 virus infection   Pneumonia due to COVID-19 virus   Healthcare-associated pneumonia   Metabolic encephalopathy   1. Acute on chronic respiratory failure with hypoxia we will continue with the weaning process try T collar today 2. COVID-19 virus infection in recovery phase 3. Pneumonia due to COVID-19 clinically improving 4. Healthcare associated pneumonia treated 5. Metabolic encephalopathy slow improvement   I have personally seen and evaluated the patient, evaluated laboratory and imaging results, formulated the assessment and plan and placed orders. The Patient requires high complexity decision making with multiple systems involvement.  Rounds were done with the Respiratory Therapy Director and Staff therapists and discussed with nursing staff also.  12/04/19, MD [facility] Pulmonary Critical Care Medicine Sleep Medicine

## 2019-10-08 ENCOUNTER — Other Ambulatory Visit (HOSPITAL_COMMUNITY): Payer: 59

## 2019-10-08 DIAGNOSIS — J189 Pneumonia, unspecified organism: Secondary | ICD-10-CM | POA: Diagnosis not present

## 2019-10-08 DIAGNOSIS — G9341 Metabolic encephalopathy: Secondary | ICD-10-CM | POA: Diagnosis not present

## 2019-10-08 DIAGNOSIS — U071 COVID-19: Secondary | ICD-10-CM | POA: Diagnosis not present

## 2019-10-08 DIAGNOSIS — J9621 Acute and chronic respiratory failure with hypoxia: Secondary | ICD-10-CM | POA: Diagnosis not present

## 2019-10-08 LAB — RENAL FUNCTION PANEL
Albumin: 2.6 g/dL — ABNORMAL LOW (ref 3.5–5.0)
Anion gap: 9 (ref 5–15)
BUN: 52 mg/dL — ABNORMAL HIGH (ref 8–23)
CO2: 28 mmol/L (ref 22–32)
Calcium: 8.6 mg/dL — ABNORMAL LOW (ref 8.9–10.3)
Chloride: 103 mmol/L (ref 98–111)
Creatinine, Ser: 0.52 mg/dL (ref 0.44–1.00)
GFR calc Af Amer: >60 mL/min (ref >60)
GFR calc non Af Amer: >60 mL/min (ref >60)
Glucose, Bld: 128 mg/dL — ABNORMAL HIGH (ref 70–99)
Phosphorus: 2.9 mg/dL (ref 2.5–4.6)
Potassium: 6.2 mmol/L — ABNORMAL HIGH (ref 3.5–5.1)
Sodium: 140 mmol/L (ref 135–145)

## 2019-10-08 LAB — CBC
HCT: 27 % — ABNORMAL LOW (ref 36.0–46.0)
Hemoglobin: 8.1 g/dL — ABNORMAL LOW (ref 12.0–15.0)
MCH: 29.7 pg (ref 26.0–34.0)
MCHC: 30 g/dL (ref 30.0–36.0)
MCV: 98.9 fL (ref 80.0–100.0)
Platelets: 249 10*3/uL (ref 150–400)
RBC: 2.73 MIL/uL — ABNORMAL LOW (ref 3.87–5.11)
RDW: 16.4 % — ABNORMAL HIGH (ref 11.5–15.5)
WBC: 14.1 10*3/uL — ABNORMAL HIGH (ref 4.0–10.5)
nRBC: 0.2 % (ref 0.0–0.2)

## 2019-10-08 LAB — MAGNESIUM: Magnesium: 1.9 mg/dL (ref 1.7–2.4)

## 2019-10-08 LAB — POTASSIUM: Potassium: 4.7 mmol/L (ref 3.5–5.1)

## 2019-10-08 NOTE — Progress Notes (Addendum)
Pulmonary Critical Care Medicine [facility] [place]   PULMONARY CRITICAL CARE SERVICE  PROGRESS NOTE  Date of Service: 10/08/2019  Jill Navarro  OVF:[id number]  DOB: [birth year]   DOA: 10/03/2019  Referring Physician: Carron Curie, MD  HPI: Jill Navarro is a 64 y.o. female seen for follow up of Acute on Chronic Respiratory Failure. Patient continues to wean on ATC for an 8 hour goal today.    Medications: Reviewed on Rounds  Physical Exam:  Vitals: pulse 80, resp 25, bp 158/76, o2 sat 95% temp 98.3  Ventilator Settings 35% ATC  . General: Comfortable at this time . Eyes: Grossly normal lids, irises & conjunctiva . ENT: grossly tongue is normal . Neck: no obvious mass . Cardiovascular: S1 S2 normal no gallop . Respiratory: no rales or ronchi noted . Abdomen: soft . Skin: no rash seen on limited exam . Musculoskeletal: not rigid . Psychiatric:unable to assess . Neurologic: no seizure no involuntary movements         Lab Data:   Basic Metabolic Panel: Recent Labs  Lab 10/04/19 0731 10/04/19 0731 10/05/19 0742 10/06/19 0637 10/07/19 1033 10/08/19 1008 10/08/19 1402  NA 156*  --  153* 149* 142 140  --   K 3.3*   < > 3.4* 4.2 4.3 6.2* 4.7  CL 116*  --  114* 110 104 103  --   CO2 29  --  28 28 27 28   --   GLUCOSE 126*  --  135* 143* 144* 128*  --   BUN 73*  --  58* 46* 47* 52*  --   CREATININE 0.88  --  0.85 0.80 0.68 0.52  --   CALCIUM 9.2  --  8.7* 8.4* 8.6* 8.6*  --   MG 2.2  --   --   --  1.7 1.9  --   PHOS 2.8  --   --   --   --  2.9  --    < > = values in this interval not displayed.    ABG: Recent Labs  Lab 10/03/19 2145 10/04/19 1755  PHART 7.388 7.424  PCO2ART 49.0* 45.9  PO2ART 152* 150*  HCO3 28.6* 29.7*  O2SAT 99.5 99.4    Liver Function Tests: Recent Labs  Lab 10/04/19 0731 10/08/19 1008  AST 72*  --   ALT 134*  --   ALKPHOS 171*  --   BILITOT 0.6  --   PROT 6.7  --   ALBUMIN 2.6* 2.6*   No results for input(s):  LIPASE, AMYLASE in the last 168 hours. No results for input(s): AMMONIA in the last 168 hours.  CBC: Recent Labs  Lab 10/04/19 0731 10/07/19 1033 10/08/19 1008  WBC 11.7* 13.6* 14.1*  NEUTROABS 9.7*  --   --   HGB 8.2* 8.6* 8.1*  HCT 27.7* 29.1* 27.0*  MCV 100.7* 98.0 98.9  PLT 296 234 249    Cardiac Enzymes: No results for input(s): CKTOTAL, CKMB, CKMBINDEX, TROPONINI in the last 168 hours.  BNP (last 3 results) No results for input(s): BNP in the last 8760 hours.  ProBNP (last 3 results) No results for input(s): PROBNP in the last 8760 hours.  Radiological Exams: DG CHEST PORT 1 VIEW  Result Date: 10/08/2019 CLINICAL DATA:  COVID pneumonia EXAM: PORTABLE CHEST 1 VIEW COMPARISON:  10/04/2019 FINDINGS: The tracheostomy tube and right IJ central venous catheters are stable. The cardiac silhouette, mediastinal and hilar contours are within normal limits and unchanged. Persistent diffuse interstitial and  airspace process in the lungs. No pleural effusion or pneumothorax. IMPRESSION: Persistent diffuse interstitial and airspace process. Electronically Signed   By: Rudie Meyer M.D.   On: 10/08/2019 05:58    Assessment/Plan Active Problems:   Acute on chronic respiratory failure with hypoxia (HCC)   COVID-19 virus infection   Pneumonia due to COVID-19 virus   Healthcare-associated pneumonia   Metabolic encephalopathy   1. Acute on chronic respiratory failure with hypoxia we will continue with the weaning process try ATC for 8 hours today.  Continue supportive measure and pulmonary toilet.  2. COVID-19 virus infection in recovery phase 3. Pneumonia due to COVID-19 clinically improving 4. Healthcare associated pneumonia treated 5. Metabolic encephalopathy slow improvement   I have personally seen and evaluated the patient, evaluated laboratory and imaging results, formulated the assessment and plan and placed orders. The Patient requires high complexity decision making with  multiple systems involvement.  Rounds were done with the Respiratory Therapy Director and Staff therapists and discussed with nursing staff also.  Yevonne Pax, MD [facility] Pulmonary Critical Care Medicine Sleep Medicine

## 2019-10-09 DIAGNOSIS — J9621 Acute and chronic respiratory failure with hypoxia: Secondary | ICD-10-CM | POA: Diagnosis not present

## 2019-10-09 DIAGNOSIS — J189 Pneumonia, unspecified organism: Secondary | ICD-10-CM | POA: Diagnosis not present

## 2019-10-09 DIAGNOSIS — U071 COVID-19: Secondary | ICD-10-CM | POA: Diagnosis not present

## 2019-10-09 DIAGNOSIS — G9341 Metabolic encephalopathy: Secondary | ICD-10-CM | POA: Diagnosis not present

## 2019-10-09 NOTE — Progress Notes (Addendum)
Pulmonary Critical Care Medicine [facility] [place]   PULMONARY CRITICAL CARE SERVICE  PROGRESS NOTE  Date of Service: 10/09/2019  [name]  CZY:[id number]  DOB: 1955/04/18   DOA: 10/03/2019  Referring Physician: Carron Curie, MD  HPI: Jill Navarro is a 64 y.o. female seen for follow up of Acute on Chronic Respiratory Failure.  Patient has a 12 hour goal today on aerosol trach collar 35% FiO2 currently satting well no fever or   Medications: Reviewed on Rounds  Physical Exam:  Vitals: distress.  Pulse  51 respirations 22 BP 143/67 O2 sat 96% temp 97.0 degrees  Ventilator Settings  ATC 35%  . General: Comfortable at this time . Eyes: Grossly normal lids, irises & conjunctiva . ENT: grossly tongue is normal . Neck: no obvious mass . Cardiovascular: S1 S2 normal no gallop . Respiratory:  No rales or rhonchi noted . Abdomen: soft . Skin: no rash seen on limited exam . Musculoskeletal: not rigid . Psychiatric:unable to assess . Neurologic: no seizure no involuntary movements         Lab Data:   Basic Metabolic Panel: Recent Labs  Lab 10/04/19 0731 10/04/19 0731 10/05/19 0742 10/06/19 0637 10/07/19 1033 10/08/19 1008 10/08/19 1402  NA 156*  --  153* 149* 142 140  --   K 3.3*   < > 3.4* 4.2 4.3 6.2* 4.7  CL 116*  --  114* 110 104 103  --   CO2 29  --  28 28 27 28   --   GLUCOSE 126*  --  135* 143* 144* 128*  --   BUN 73*  --  58* 46* 47* 52*  --   CREATININE 0.88  --  0.85 0.80 0.68 0.52  --   CALCIUM 9.2  --  8.7* 8.4* 8.6* 8.6*  --   MG 2.2  --   --   --  1.7 1.9  --   PHOS 2.8  --   --   --   --  2.9  --    < > = values in this interval not displayed.    ABG: Recent Labs  Lab 10/03/19 2145 10/04/19 1755  PHART 7.388 7.424  PCO2ART 49.0* 45.9  PO2ART 152* 150*  HCO3 28.6* 29.7*  O2SAT 99.5 99.4    Liver Function Tests: Recent Labs  Lab 10/04/19 0731 10/08/19 1008  AST 72*  --   ALT 134*  --   ALKPHOS 171*  --   BILITOT 0.6  --    PROT 6.7  --   ALBUMIN 2.6* 2.6*   No results for input(s): LIPASE, AMYLASE in the last 168 hours. No results for input(s): AMMONIA in the last 168 hours.  CBC: Recent Labs  Lab 10/04/19 0731 10/07/19 1033 10/08/19 1008  WBC 11.7* 13.6* 14.1*  NEUTROABS 9.7*  --   --   HGB 8.2* 8.6* 8.1*  HCT 27.7* 29.1* 27.0*  MCV 100.7* 98.0 98.9  PLT 296 234 249    Cardiac Enzymes: No results for input(s): CKTOTAL, CKMB, CKMBINDEX, TROPONINI in the last 168 hours.  BNP (last 3 results) No results for input(s): BNP in the last 8760 hours.  ProBNP (last 3 results) No results for input(s): PROBNP in the last 8760 hours.  Radiological Exams: DG CHEST PORT 1 VIEW  Result Date: 10/08/2019 CLINICAL DATA:  COVID pneumonia EXAM: PORTABLE CHEST 1 VIEW COMPARISON:  10/04/2019 FINDINGS: The tracheostomy tube and right IJ central venous catheters are stable. The cardiac silhouette, mediastinal  and hilar contours are within normal limits and unchanged. Persistent diffuse interstitial and airspace process in the lungs. No pleural effusion or pneumothorax. IMPRESSION: Persistent diffuse interstitial and airspace process. Electronically Signed   By: Rudie Meyer M.D.   On: 10/08/2019 05:58    Assessment/Plan Active Problems:   Acute on chronic respiratory failure with hypoxia (HCC)   COVID-19 virus infection   Pneumonia due to COVID-19 virus   Healthcare-associated pneumonia   Metabolic encephalopathy   1. Acute on chronic respiratory failure with hypoxia we will continue with the weaning process try ATC for 12 hours today.  Continue supportive measure and pulmonary toilet.  2. COVID-19 virus infection in recovery phase 3. Pneumonia due to COVID-19 clinically improving 4. Healthcare associated pneumonia treated 5. Metabolic encephalopathy slow improvement   I have personally seen and evaluated the patient, evaluated laboratory and imaging results, formulated the assessment and plan and placed  orders. The Patient requires high complexity decision making with multiple systems involvement.  Rounds were done with the Respiratory Therapy Director and Staff therapists and discussed with nursing staff also.  Yevonne Pax, MD [facility] Pulmonary Critical Care Medicine Sleep Medicine

## 2019-10-10 ENCOUNTER — Other Ambulatory Visit (HOSPITAL_COMMUNITY): Payer: 59

## 2019-10-10 DIAGNOSIS — J189 Pneumonia, unspecified organism: Secondary | ICD-10-CM | POA: Diagnosis not present

## 2019-10-10 DIAGNOSIS — U071 COVID-19: Secondary | ICD-10-CM | POA: Diagnosis not present

## 2019-10-10 DIAGNOSIS — G9341 Metabolic encephalopathy: Secondary | ICD-10-CM | POA: Diagnosis not present

## 2019-10-10 DIAGNOSIS — J9621 Acute and chronic respiratory failure with hypoxia: Secondary | ICD-10-CM | POA: Diagnosis not present

## 2019-10-10 LAB — RENAL FUNCTION PANEL
Albumin: 2.5 g/dL — ABNORMAL LOW (ref 3.5–5.0)
Anion gap: 10 (ref 5–15)
BUN: 42 mg/dL — ABNORMAL HIGH (ref 8–23)
CO2: 30 mmol/L (ref 22–32)
Calcium: 9 mg/dL (ref 8.9–10.3)
Chloride: 102 mmol/L (ref 98–111)
Creatinine, Ser: 0.45 mg/dL (ref 0.44–1.00)
GFR calc Af Amer: >60 mL/min (ref >60)
GFR calc non Af Amer: >60 mL/min (ref >60)
Glucose, Bld: 115 mg/dL — ABNORMAL HIGH (ref 70–99)
Phosphorus: 3 mg/dL (ref 2.5–4.6)
Potassium: 4.1 mmol/L (ref 3.5–5.1)
Sodium: 142 mmol/L (ref 135–145)

## 2019-10-10 LAB — CBC
HCT: 27.5 % — ABNORMAL LOW (ref 36.0–46.0)
Hemoglobin: 8.2 g/dL — ABNORMAL LOW (ref 12.0–15.0)
MCH: 29.1 pg (ref 26.0–34.0)
MCHC: 29.8 g/dL — ABNORMAL LOW (ref 30.0–36.0)
MCV: 97.5 fL (ref 80.0–100.0)
Platelets: 197 10*3/uL (ref 150–400)
RBC: 2.82 MIL/uL — ABNORMAL LOW (ref 3.87–5.11)
RDW: 17.2 % — ABNORMAL HIGH (ref 11.5–15.5)
WBC: 9.9 10*3/uL (ref 4.0–10.5)
nRBC: 0.4 % — ABNORMAL HIGH (ref 0.0–0.2)

## 2019-10-10 LAB — MAGNESIUM: Magnesium: 1.7 mg/dL (ref 1.7–2.4)

## 2019-10-10 NOTE — Progress Notes (Addendum)
Pulmonary Critical Care Medicine [facility] [place]   PULMONARY CRITICAL CARE SERVICE  PROGRESS NOTE  Date of Service: 10/10/2019  Jill Navarro  XTK:[id number]  DOB: [birth year]   DOA: 10/03/2019  Referring Physician: Carron Curie, MD  HPI: Jill Navarro is a 64 y.o. female seen for follow up of Acute on Chronic Respiratory Failure.  Patient remains on aerosol trach collar 35% for 16 hours today satting no fever distress.  Medications: Reviewed on Rounds  Physical Exam:  Vitals:   Pulse 82 respirations 26 BP 146/73 O2 sat 99% temp 97.0 degrees  Ventilator Settings  ATC 35%  . General: Comfortable at this time . Eyes: Grossly normal lids, irises & conjunctiva . ENT: grossly tongue is normal . Neck: no obvious mass . Cardiovascular: S1 S2 normal no gallop . Respiratory:  Coarse breath sounds . Abdomen: soft . Skin: no rash seen on limited exam . Musculoskeletal: not rigid . Psychiatric:unable to assess . Neurologic: no seizure no involuntary movements         Lab Data:   Basic Metabolic Panel: Recent Labs  Lab 10/04/19 0731 10/04/19 0731 10/05/19 0742 10/05/19 0742 10/06/19 0637 10/07/19 1033 10/08/19 1008 10/08/19 1402 10/10/19 0735  NA 156*   < > 153*  --  149* 142 140  --  142  K 3.3*   < > 3.4*   < > 4.2 4.3 6.2* 4.7 4.1  CL 116*   < > 114*  --  110 104 103  --  102  CO2 29   < > 28  --  28 27 28   --  30  GLUCOSE 126*   < > 135*  --  143* 144* 128*  --  115*  BUN 73*   < > 58*  --  46* 47* 52*  --  42*  CREATININE 0.88   < > 0.85  --  0.80 0.68 0.52  --  0.45  CALCIUM 9.2   < > 8.7*  --  8.4* 8.6* 8.6*  --  9.0  MG 2.2  --   --   --   --  1.7 1.9  --  1.7  PHOS 2.8  --   --   --   --   --  2.9  --  3.0   < > = values in this interval not displayed.    ABG: Recent Labs  Lab 10/03/19 2145 10/04/19 1755  PHART 7.388 7.424  PCO2ART 49.0* 45.9  PO2ART 152* 150*  HCO3 28.6* 29.7*  O2SAT 99.5 99.4    Liver Function Tests: Recent Labs   Lab 10/04/19 0731 10/08/19 1008 10/10/19 0735  AST 72*  --   --   ALT 134*  --   --   ALKPHOS 171*  --   --   BILITOT 0.6  --   --   PROT 6.7  --   --   ALBUMIN 2.6* 2.6* 2.5*   No results for input(s): LIPASE, AMYLASE in the last 168 hours. No results for input(s): AMMONIA in the last 168 hours.  CBC: Recent Labs  Lab 10/04/19 0731 10/07/19 1033 10/08/19 1008 10/10/19 0735  WBC 11.7* 13.6* 14.1* 9.9  NEUTROABS 9.7*  --   --   --   HGB 8.2* 8.6* 8.1* 8.2*  HCT 27.7* 29.1* 27.0* 27.5*  MCV 100.7* 98.0 98.9 97.5  PLT 296 234 249 197    Cardiac Enzymes: No results for input(s): CKTOTAL, CKMB, CKMBINDEX, TROPONINI in the last 168  hours.  BNP (last 3 results) No results for input(s): BNP in the last 8760 hours.  ProBNP (last 3 results) No results for input(s): PROBNP in the last 8760 hours.  Radiological Exams: DG CHEST PORT 1 VIEW  Result Date: 10/10/2019 CLINICAL DATA:  COVID pneumonia EXAM: PORTABLE CHEST 1 VIEW COMPARISON:  10/08/2019 FINDINGS: The tracheostomy tube is in good position, unchanged. Right IJ central venous catheter in good position, unchanged. The cardiac silhouette, mediastinal and hilar contours are within normal limits and stable. Persistent interstitial thickening and patchy airspace infiltrates in both lungs. No pleural effusion or pneumothorax. IMPRESSION: Persistent interstitial thickening and patchy airspace infiltrates. Electronically Signed   By: Rudie Meyer M.D.   On: 10/10/2019 06:37    Assessment/Plan Active Problems:   Acute on chronic respiratory failure with hypoxia (HCC)   COVID-19 virus infection   Pneumonia due to COVID-19 virus   Healthcare-associated pneumonia   Metabolic encephalopathy   1. Acute on chronic respiratory failure with hypoxia we will continue with the weaning process try ATC for 16 hours today.  Continue supportive measure and pulmonary toilet.  2. COVID-19 virus infection in recovery phase 3. Pneumonia due  to COVID-19 clinically improving 4. Healthcare associated pneumonia treated 5. Metabolic encephalopathy slow improvement   I have personally seen and evaluated the patient, evaluated laboratory and imaging results, formulated the assessment and plan and placed orders. The Patient requires high complexity decision making with multiple systems involvement.  Rounds were done with the Respiratory Therapy Director and Staff therapists and discussed with nursing staff also.  Yevonne Pax, MD [facility] Pulmonary Critical Care Medicine Sleep Medicine

## 2019-10-11 DIAGNOSIS — J189 Pneumonia, unspecified organism: Secondary | ICD-10-CM | POA: Diagnosis not present

## 2019-10-11 DIAGNOSIS — G9341 Metabolic encephalopathy: Secondary | ICD-10-CM | POA: Diagnosis not present

## 2019-10-11 DIAGNOSIS — U071 COVID-19: Secondary | ICD-10-CM | POA: Diagnosis not present

## 2019-10-11 DIAGNOSIS — J9621 Acute and chronic respiratory failure with hypoxia: Secondary | ICD-10-CM | POA: Diagnosis not present

## 2019-10-11 LAB — CULTURE, BLOOD (ROUTINE X 2)
Culture: NO GROWTH
Culture: NO GROWTH

## 2019-10-11 LAB — MAGNESIUM: Magnesium: 1.8 mg/dL (ref 1.7–2.4)

## 2019-10-11 NOTE — Progress Notes (Addendum)
Pulmonary Critical Care Medicine [facility] [place]   PULMONARY CRITICAL CARE SERVICE  PROGRESS NOTE  Date of Service: 10/11/2019  Jill Navarro  [id number]:[id number]  DOB: [birth year]   DOA: 10/03/2019  Referring Physician: Carron Curie, MD  HPI: Jill Navarro is a 64 y.o. female seen for follow up of Acute on Chronic Respiratory Failure.  Patient continues on 35% T-bar for a 20-hour goal at this time currently satting well no fever or distress.  Medications: Reviewed on Rounds  Physical Exam:  Vitals: Pulse 76 respirations 28 BP 144/80 O2 sat 98% temp 97.9  Ventilator Settings not currently on ventilator  . General: Comfortable at this time . Eyes: Grossly normal lids, irises & conjunctiva . ENT: grossly tongue is normal . Neck: no obvious mass . Cardiovascular: S1 S2 normal no gallop . Respiratory: No rales or rhonchi noted . Abdomen: soft . Skin: no rash seen on limited exam . Musculoskeletal: not rigid . Psychiatric:unable to assess . Neurologic: no seizure no involuntary movements         Lab Data:   Basic Metabolic Panel: Recent Labs  Lab 10/05/19 0742 10/05/19 0742 10/06/19 0637 10/07/19 1033 10/08/19 1008 10/08/19 1402 10/10/19 0735  NA 153*  --  149* 142 140  --  142  K 3.4*   < > 4.2 4.3 6.2* 4.7 4.1  CL 114*  --  110 104 103  --  102  CO2 28  --  28 27 28   --  30  GLUCOSE 135*  --  143* 144* 128*  --  115*  BUN 58*  --  46* 47* 52*  --  42*  CREATININE 0.85  --  0.80 0.68 0.52  --  0.45  CALCIUM 8.7*  --  8.4* 8.6* 8.6*  --  9.0  MG  --   --   --  1.7 1.9  --  1.7  PHOS  --   --   --   --  2.9  --  3.0   < > = values in this interval not displayed.    ABG: Recent Labs  Lab 10/04/19 1755  PHART 7.424  PCO2ART 45.9  PO2ART 150*  HCO3 29.7*  O2SAT 99.4    Liver Function Tests: Recent Labs  Lab 10/08/19 1008 10/10/19 0735  ALBUMIN 2.6* 2.5*   No results for input(s): LIPASE, AMYLASE in the last 168 hours. No results for  input(s): AMMONIA in the last 168 hours.  CBC: Recent Labs  Lab 10/07/19 1033 10/08/19 1008 10/10/19 0735  WBC 13.6* 14.1* 9.9  HGB 8.6* 8.1* 8.2*  HCT 29.1* 27.0* 27.5*  MCV 98.0 98.9 97.5  PLT 234 249 197    Cardiac Enzymes: No results for input(s): CKTOTAL, CKMB, CKMBINDEX, TROPONINI in the last 168 hours.  BNP (last 3 results) No results for input(s): BNP in the last 8760 hours.  ProBNP (last 3 results) No results for input(s): PROBNP in the last 8760 hours.  Radiological Exams: DG CHEST PORT 1 VIEW  Result Date: 10/10/2019 CLINICAL DATA:  COVID pneumonia EXAM: PORTABLE CHEST 1 VIEW COMPARISON:  10/08/2019 FINDINGS: The tracheostomy tube is in good position, unchanged. Right IJ central venous catheter in good position, unchanged. The cardiac silhouette, mediastinal and hilar contours are within normal limits and stable. Persistent interstitial thickening and patchy airspace infiltrates in both lungs. No pleural effusion or pneumothorax. IMPRESSION: Persistent interstitial thickening and patchy airspace infiltrates. Electronically Signed   By: 10/10/2019 M.D.   On: 10/10/2019  06:37    Assessment/Plan Active Problems:   Acute on chronic respiratory failure with hypoxia (HCC)   COVID-19 virus infection   Pneumonia due to COVID-19 virus   Healthcare-associated pneumonia   Metabolic encephalopathy   1. Acute on chronic respiratory failure with hypoxia we will continue with the weaning process tryATC for 20 hours today. Continue supportive measure and pulmonary toilet.  2. COVID-19 virus infection in recovery phase 3. Pneumonia due to COVID-19 clinically improving 4. Healthcare associated pneumonia treated 5. Metabolic encephalopathy slow improvement   I have personally seen and evaluated the patient, evaluated laboratory and imaging results, formulated the assessment and plan and placed orders. The Patient requires high complexity decision making with multiple  systems involvement.  Rounds were done with the Respiratory Therapy Director and Staff therapists and discussed with nursing staff also.  Yevonne Pax, MD [facility] Pulmonary Critical Care Medicine Sleep Medicine

## 2019-10-12 DIAGNOSIS — J9621 Acute and chronic respiratory failure with hypoxia: Secondary | ICD-10-CM | POA: Diagnosis not present

## 2019-10-12 DIAGNOSIS — G9341 Metabolic encephalopathy: Secondary | ICD-10-CM | POA: Diagnosis not present

## 2019-10-12 DIAGNOSIS — J189 Pneumonia, unspecified organism: Secondary | ICD-10-CM | POA: Diagnosis not present

## 2019-10-12 DIAGNOSIS — U071 COVID-19: Secondary | ICD-10-CM | POA: Diagnosis not present

## 2019-10-12 LAB — BLOOD GAS, ARTERIAL
Acid-Base Excess: 7.9 mmol/L — ABNORMAL HIGH (ref 0.0–2.0)
Bicarbonate: 32.4 mmol/L — ABNORMAL HIGH (ref 20.0–28.0)
FIO2: 35
O2 Saturation: 98.2 %
Patient temperature: 36.5
pCO2 arterial: 49.2 mmHg — ABNORMAL HIGH (ref 32.0–48.0)
pH, Arterial: 7.432 (ref 7.350–7.450)
pO2, Arterial: 91.4 mmHg (ref 83.0–108.0)

## 2019-10-12 LAB — BASIC METABOLIC PANEL
Anion gap: 10 (ref 5–15)
BUN: 36 mg/dL — ABNORMAL HIGH (ref 8–23)
CO2: 30 mmol/L (ref 22–32)
Calcium: 9 mg/dL (ref 8.9–10.3)
Chloride: 100 mmol/L (ref 98–111)
Creatinine, Ser: 0.41 mg/dL — ABNORMAL LOW (ref 0.44–1.00)
GFR calc Af Amer: >60 mL/min (ref >60)
GFR calc non Af Amer: >60 mL/min (ref >60)
Glucose, Bld: 100 mg/dL — ABNORMAL HIGH (ref 70–99)
Potassium: 4.3 mmol/L (ref 3.5–5.1)
Sodium: 140 mmol/L (ref 135–145)

## 2019-10-12 LAB — CBC
HCT: 28.1 % — ABNORMAL LOW (ref 36.0–46.0)
Hemoglobin: 8.8 g/dL — ABNORMAL LOW (ref 12.0–15.0)
MCH: 30.9 pg (ref 26.0–34.0)
MCHC: 31.3 g/dL (ref 30.0–36.0)
MCV: 98.6 fL (ref 80.0–100.0)
Platelets: 204 10*3/uL (ref 150–400)
RBC: 2.85 MIL/uL — ABNORMAL LOW (ref 3.87–5.11)
RDW: 18.9 % — ABNORMAL HIGH (ref 11.5–15.5)
WBC: 10.7 10*3/uL — ABNORMAL HIGH (ref 4.0–10.5)
nRBC: 0 % (ref 0.0–0.2)

## 2019-10-12 LAB — PHOSPHORUS: Phosphorus: 3.4 mg/dL (ref 2.5–4.6)

## 2019-10-12 LAB — MAGNESIUM: Magnesium: 1.8 mg/dL (ref 1.7–2.4)

## 2019-10-12 NOTE — Progress Notes (Addendum)
Pulmonary Critical Care Medicine [facility] [place]   PULMONARY CRITICAL CARE SERVICE  PROGRESS NOTE  Date of Service: 10/12/2019  Lanette Ell  SWF:[id number]  DOB: [birth year]   DOA: 10/03/2019  Referring Physician: Carron Curie, MD  HPI: Jill Navarro is a 64 y.o. female seen for follow up of Acute on Chronic Respiratory Failure.  Patient remains on T-bar 35% has a goal of 48 hours at this time satting well no fever distress.  Medications: Reviewed on Rounds  Physical Exam:  Vitals: Pulse 85 respirations 35 BP 147/80 O2 sat 100% temp 97.0  Ventilator Settings 35% T-bar  . General: Comfortable at this time . Eyes: Grossly normal lids, irises & conjunctiva . ENT: grossly tongue is normal . Neck: no obvious mass . Cardiovascular: S1 S2 normal no gallop . Respiratory: No rales or rhonchi noted . Abdomen: soft . Skin: no rash seen on limited exam . Musculoskeletal: not rigid . Psychiatric:unable to assess . Neurologic: no seizure no involuntary movements         Lab Data:   Basic Metabolic Panel: Recent Labs  Lab 10/06/19 0637 10/06/19 0637 10/07/19 1033 10/08/19 1008 10/08/19 1402 10/10/19 0735 10/11/19 1236 10/12/19 0618  NA 149*  --  142 140  --  142  --  140  K 4.2   < > 4.3 6.2* 4.7 4.1  --  4.3  CL 110  --  104 103  --  102  --  100  CO2 28  --  27 28  --  30  --  30  GLUCOSE 143*  --  144* 128*  --  115*  --  100*  BUN 46*  --  47* 52*  --  42*  --  36*  CREATININE 0.80  --  0.68 0.52  --  0.45  --  0.41*  CALCIUM 8.4*  --  8.6* 8.6*  --  9.0  --  9.0  MG  --   --  1.7 1.9  --  1.7 1.8 1.8  PHOS  --   --   --  2.9  --  3.0  --  3.4   < > = values in this interval not displayed.    ABG: Recent Labs  Lab 10/12/19 0835  PHART 7.432  PCO2ART 49.2*  PO2ART 91.4  HCO3 32.4*  O2SAT 98.2    Liver Function Tests: Recent Labs  Lab 10/08/19 1008 10/10/19 0735  ALBUMIN 2.6* 2.5*   No results for input(s): LIPASE, AMYLASE in the last  168 hours. No results for input(s): AMMONIA in the last 168 hours.  CBC: Recent Labs  Lab 10/07/19 1033 10/08/19 1008 10/10/19 0735 10/12/19 0618  WBC 13.6* 14.1* 9.9 10.7*  HGB 8.6* 8.1* 8.2* 8.8*  HCT 29.1* 27.0* 27.5* 28.1*  MCV 98.0 98.9 97.5 98.6  PLT 234 249 197 204    Cardiac Enzymes: No results for input(s): CKTOTAL, CKMB, CKMBINDEX, TROPONINI in the last 168 hours.  BNP (last 3 results) No results for input(s): BNP in the last 8760 hours.  ProBNP (last 3 results) No results for input(s): PROBNP in the last 8760 hours.  Radiological Exams: No results found.  Assessment/Plan Active Problems:   Acute on chronic respiratory failure with hypoxia (HCC)   COVID-19 virus infection   Pneumonia due to COVID-19 virus   Healthcare-associated pneumonia   Metabolic encephalopathy   1. Acute on chronic respiratory failure with hypoxia we will continue with the weaning process tryATC for48hours today. Continue  supportive measure and pulmonary toilet.  2. COVID-19 virus infection in recovery phase 3. Pneumonia due to COVID-19 clinically improving 4. Healthcare associated pneumonia treated 5. Metabolic encephalopathy slow improvement   I have personally seen and evaluated the patient, evaluated laboratory and imaging results, formulated the assessment and plan and placed orders. The Patient requires high complexity decision making with multiple systems involvement.  Rounds were done with the Respiratory Therapy Director and Staff therapists and discussed with nursing staff also.  Yevonne Pax, MD [facility] Pulmonary Critical Care Medicine Sleep Medicine As I will follow take a while that same follow make quick work-up

## 2019-10-13 DIAGNOSIS — J189 Pneumonia, unspecified organism: Secondary | ICD-10-CM | POA: Diagnosis not present

## 2019-10-13 DIAGNOSIS — G9341 Metabolic encephalopathy: Secondary | ICD-10-CM | POA: Diagnosis not present

## 2019-10-13 DIAGNOSIS — J9621 Acute and chronic respiratory failure with hypoxia: Secondary | ICD-10-CM | POA: Diagnosis not present

## 2019-10-13 DIAGNOSIS — U071 COVID-19: Secondary | ICD-10-CM | POA: Diagnosis not present

## 2019-10-13 NOTE — Progress Notes (Signed)
Pulmonary Critical Care Medicine [facility] [place]   PULMONARY CRITICAL CARE SERVICE  PROGRESS NOTE  Date of Service: 10/13/2019  Jill Navarro  WNI:[id number]  DOB: Jill Navarro   DOA: 10/03/2019  Referring Physician: Carron Curie, MD  HPI: Jill Navarro is a 64 y.o. female seen for follow up of Acute on Chronic Respiratory Failure.  Right now patient is afebrile without distress has been weaning on T-bar  Medications: Reviewed on Rounds  Physical Exam:  Vitals: Temperature 98.1 pulse 92 respiratory 29 blood pressure is 158/83 saturations 97%  Ventilator Settings currently on weaning protocol  . General: Comfortable at this time . Eyes: Grossly normal lids, irises & conjunctiva . ENT: grossly tongue is normal . Neck: no obvious mass . Cardiovascular: S1 S2 normal no gallop . Respiratory: No rhonchi no rales . Abdomen: soft . Skin: no rash seen on limited exam . Musculoskeletal: not rigid . Psychiatric:unable to assess . Neurologic: no seizure no involuntary movements         Lab Data:   Basic Metabolic Panel: Recent Labs  Lab 10/07/19 1033 10/08/19 1008 10/08/19 1402 10/10/19 0735 10/11/19 1236 10/12/19 0618  NA 142 140  --  142  --  140  K 4.3 6.2* 4.7 4.1  --  4.3  CL 104 103  --  102  --  100  CO2 27 28  --  30  --  30  GLUCOSE 144* 128*  --  115*  --  100*  BUN 47* 52*  --  42*  --  36*  CREATININE 0.68 0.52  --  0.45  --  0.41*  CALCIUM 8.6* 8.6*  --  9.0  --  9.0  MG 1.7 1.9  --  1.7 1.8 1.8  PHOS  --  2.9  --  3.0  --  3.4    ABG: Recent Labs  Lab 10/12/19 0835  PHART 7.432  PCO2ART 49.2*  PO2ART 91.4  HCO3 32.4*  O2SAT 98.2    Liver Function Tests: Recent Labs  Lab 10/08/19 1008 10/10/19 0735  ALBUMIN 2.6* 2.5*   No results for input(s): LIPASE, AMYLASE in the last 168 hours. No results for input(s): AMMONIA in the last 168 hours.  CBC: Recent Labs  Lab 10/07/19 1033 10/08/19 1008 10/10/19 0735 10/12/19 0618  WBC  13.6* 14.1* 9.9 10.7*  HGB 8.6* 8.1* 8.2* 8.8*  HCT 29.1* 27.0* 27.5* 28.1*  MCV 98.0 98.9 97.5 98.6  PLT 234 249 197 204    Cardiac Enzymes: No results for input(s): CKTOTAL, CKMB, CKMBINDEX, TROPONINI in the last 168 hours.  BNP (last 3 results) No results for input(s): BNP in the last 8760 hours.  ProBNP (last 3 results) No results for input(s): PROBNP in the last 8760 hours.  Radiological Exams: No results found.  Assessment/Plan Active Problems:   Acute on chronic respiratory failure with hypoxia (HCC)   COVID-19 virus infection   Pneumonia due to COVID-19 virus   Healthcare-associated pneumonia   Metabolic encephalopathy   1. Acute on chronic respiratory failure with hypoxia we will continue with weaning protocol as tolerated continue secretion management supportive care. 2. COVID-19 virus infection at baseline we will continue to follow 3. Pneumonia due to COVID-19 treated we will continue supportive care and follow along 4. Healthcare associated pneumonia treated 5. Metabolic encephalopathy no change   I have personally seen and evaluated the patient, evaluated laboratory and imaging results, formulated the assessment and plan and placed orders. The Patient requires high complexity  decision making with multiple systems involvement.  Rounds were done with the Respiratory Therapy Director and Staff therapists and discussed with nursing staff also.  Allyne Gee, MD [facility] Pulmonary Critical Care Medicine Sleep Medicine

## 2019-10-14 ENCOUNTER — Other Ambulatory Visit (HOSPITAL_COMMUNITY): Payer: 59

## 2019-10-14 DIAGNOSIS — G9341 Metabolic encephalopathy: Secondary | ICD-10-CM | POA: Diagnosis not present

## 2019-10-14 DIAGNOSIS — U071 COVID-19: Secondary | ICD-10-CM | POA: Diagnosis not present

## 2019-10-14 DIAGNOSIS — J189 Pneumonia, unspecified organism: Secondary | ICD-10-CM | POA: Diagnosis not present

## 2019-10-14 DIAGNOSIS — J9621 Acute and chronic respiratory failure with hypoxia: Secondary | ICD-10-CM | POA: Diagnosis not present

## 2019-10-14 LAB — PHOSPHORUS: Phosphorus: 3.7 mg/dL (ref 2.5–4.6)

## 2019-10-14 LAB — CBC
HCT: 30.1 % — ABNORMAL LOW (ref 36.0–46.0)
Hemoglobin: 9.6 g/dL — ABNORMAL LOW (ref 12.0–15.0)
MCH: 31.4 pg (ref 26.0–34.0)
MCHC: 31.9 g/dL (ref 30.0–36.0)
MCV: 98.4 fL (ref 80.0–100.0)
Platelets: 212 10*3/uL (ref 150–400)
RBC: 3.06 MIL/uL — ABNORMAL LOW (ref 3.87–5.11)
RDW: 20.1 % — ABNORMAL HIGH (ref 11.5–15.5)
WBC: 15.8 10*3/uL — ABNORMAL HIGH (ref 4.0–10.5)
nRBC: 0 % (ref 0.0–0.2)

## 2019-10-14 LAB — BASIC METABOLIC PANEL
Anion gap: 13 (ref 5–15)
BUN: 29 mg/dL — ABNORMAL HIGH (ref 8–23)
CO2: 29 mmol/L (ref 22–32)
Calcium: 9.4 mg/dL (ref 8.9–10.3)
Chloride: 97 mmol/L — ABNORMAL LOW (ref 98–111)
Creatinine, Ser: 0.46 mg/dL (ref 0.44–1.00)
GFR calc Af Amer: >60 mL/min (ref >60)
GFR calc non Af Amer: >60 mL/min (ref >60)
Glucose, Bld: 98 mg/dL (ref 70–99)
Potassium: 4 mmol/L (ref 3.5–5.1)
Sodium: 139 mmol/L (ref 135–145)

## 2019-10-14 LAB — MAGNESIUM: Magnesium: 1.6 mg/dL — ABNORMAL LOW (ref 1.7–2.4)

## 2019-10-14 NOTE — Progress Notes (Signed)
Pulmonary Critical Care Medicine [facility] [place]   PULMONARY CRITICAL CARE SERVICE  PROGRESS NOTE  Date of Service: 10/14/2019  Jill Navarro  WUJ:[id number]  DOB: 1955-09-17   DOA: 10/03/2019  Referring Physician: Carron Curie, MD  HPI: Jill Navarro is a 64 y.o. female seen for follow up of Acute on Chronic Respiratory Failure.  Patient currently is on T collar has been on 20% FiO2 ready for changing the trach to a cuffless trach  Medications: Reviewed on Rounds  Physical Exam:  Vitals: Temperature is 98.6 pulse 81 respiratory rate 30 blood pressure is 141/78 saturations 95%  Ventilator Settings off the ventilator on T collar FiO2 28%  . General: Comfortable at this time . Eyes: Grossly normal lids, irises & conjunctiva . ENT: grossly tongue is normal . Neck: no obvious mass . Cardiovascular: S1 S2 normal no gallop . Respiratory: No rhonchi no rales are noted . Abdomen: soft . Skin: no rash seen on limited exam . Musculoskeletal: not rigid . Psychiatric:unable to assess . Neurologic: no seizure no involuntary movements         Lab Data:   Basic Metabolic Panel: Recent Labs  Lab 10/07/19 1033 10/07/19 1033 10/08/19 1008 10/08/19 1402 10/10/19 0735 10/11/19 1236 10/12/19 0618 10/14/19 0429  NA 142  --  140  --  142  --  140 139  K 4.3   < > 6.2* 4.7 4.1  --  4.3 4.0  CL 104  --  103  --  102  --  100 97*  CO2 27  --  28  --  30  --  30 29  GLUCOSE 144*  --  128*  --  115*  --  100* 98  BUN 47*  --  52*  --  42*  --  36* 29*  CREATININE 0.68  --  0.52  --  0.45  --  0.41* 0.46  CALCIUM 8.6*  --  8.6*  --  9.0  --  9.0 9.4  MG 1.7   < > 1.9  --  1.7 1.8 1.8 1.6*  PHOS  --   --  2.9  --  3.0  --  3.4 3.7   < > = values in this interval not displayed.    ABG: Recent Labs  Lab 10/12/19 0835  PHART 7.432  PCO2ART 49.2*  PO2ART 91.4  HCO3 32.4*  O2SAT 98.2    Liver Function Tests: Recent Labs  Lab 10/08/19 1008 10/10/19 0735  ALBUMIN  2.6* 2.5*   No results for input(s): LIPASE, AMYLASE in the last 168 hours. No results for input(s): AMMONIA in the last 168 hours.  CBC: Recent Labs  Lab 10/07/19 1033 10/08/19 1008 10/10/19 0735 10/12/19 0618 10/14/19 0429  WBC 13.6* 14.1* 9.9 10.7* 15.8*  HGB 8.6* 8.1* 8.2* 8.8* 9.6*  HCT 29.1* 27.0* 27.5* 28.1* 30.1*  MCV 98.0 98.9 97.5 98.6 98.4  PLT 234 249 197 204 212    Cardiac Enzymes: No results for input(s): CKTOTAL, CKMB, CKMBINDEX, TROPONINI in the last 168 hours.  BNP (last 3 results) No results for input(s): BNP in the last 8760 hours.  ProBNP (last 3 results) No results for input(s): PROBNP in the last 8760 hours.  Radiological Exams: DG CHEST PORT 1 VIEW  Result Date: 10/14/2019 CLINICAL DATA:  Pneumonia EXAM: PORTABLE CHEST 1 VIEW COMPARISON:  10/10/2019 FINDINGS: Un tracheostomy is unchanged. Right internal jugular central venous catheter has been removed. Pulmonary insufflation is stable and symmetric. Superimposed diffuse  airspace infiltrate, slightly more severe within the left lung, appears stable since prior examination. No pneumothorax or pleural effusion. Cardiac size within normal limits. Pulmonary vascularity is normal. IMPRESSION: Stable diffuse, slightly asymmetric pulmonary infiltrate. Electronically Signed   By: Helyn Numbers MD   On: 10/14/2019 06:37    Assessment/Plan Active Problems:   Acute on chronic respiratory failure with hypoxia (HCC)   COVID-19 virus infection   Pneumonia due to COVID-19 virus   Healthcare-associated pneumonia   Metabolic encephalopathy   1. Acute on chronic respiratory failure with hypoxia patient continues on T collar on 28% FiO2 proceed to changing over to a cuffless trach 2. COVID-19 virus infection continue with supportive care. 3. Pneumonia due to COVID-19 has been treated slow improvement 4. Healthcare associated pneumonia slow improvement 5. Metabolic encephalopathy no change we will continue to  follow   I have personally seen and evaluated the patient, evaluated laboratory and imaging results, formulated the assessment and plan and placed orders. The Patient requires high complexity decision making with multiple systems involvement.  Rounds were done with the Respiratory Therapy Director and Staff therapists and discussed with nursing staff also.  Yevonne Pax, MD [facility] Pulmonary Critical Care Medicine Sleep Medicine

## 2019-10-15 DIAGNOSIS — U071 COVID-19: Secondary | ICD-10-CM | POA: Diagnosis not present

## 2019-10-15 DIAGNOSIS — G9341 Metabolic encephalopathy: Secondary | ICD-10-CM | POA: Diagnosis not present

## 2019-10-15 DIAGNOSIS — J189 Pneumonia, unspecified organism: Secondary | ICD-10-CM | POA: Diagnosis not present

## 2019-10-15 DIAGNOSIS — J9621 Acute and chronic respiratory failure with hypoxia: Secondary | ICD-10-CM | POA: Diagnosis not present

## 2019-10-15 LAB — MAGNESIUM: Magnesium: 1.8 mg/dL (ref 1.7–2.4)

## 2019-10-15 NOTE — Progress Notes (Signed)
Pulmonary Critical Care Medicine [facility] [place]   PULMONARY CRITICAL CARE SERVICE  PROGRESS NOTE  Date of Service: 10/15/2019  Jill Navarro  LXB:[id number]  DOB: May 01, 1955   DOA: 10/03/2019  Referring Physician: Carron Curie, MD  HPI: Jill Navarro is a 64 y.o. female seen for follow up of Acute on Chronic Respiratory Failure.  Patient right now is on T-bar has been on 28% FiO2 should be able to start capping trials  Medications: Reviewed on Rounds  Physical Exam:  Vitals: Temperature is 97.4 pulse 80 respiratory rate 22 blood pressure is 140/75 saturations 93%  Ventilator Settings on T-piece FiO2 28%  . General: Comfortable at this time . Eyes: Grossly normal lids, irises & conjunctiva . ENT: grossly tongue is normal . Neck: no obvious mass . Cardiovascular: S1 S2 normal no gallop . Respiratory: No rhonchi no rales are noted at this time . Abdomen: soft . Skin: no rash seen on limited exam . Musculoskeletal: not rigid . Psychiatric:unable to assess . Neurologic: no seizure no involuntary movements         Lab Data:   Basic Metabolic Panel: Recent Labs  Lab 10/10/19 0735 10/11/19 1236 10/12/19 0618 10/14/19 0429 10/15/19 1219  NA 142  --  140 139  --   K 4.1  --  4.3 4.0  --   CL 102  --  100 97*  --   CO2 30  --  30 29  --   GLUCOSE 115*  --  100* 98  --   BUN 42*  --  36* 29*  --   CREATININE 0.45  --  0.41* 0.46  --   CALCIUM 9.0  --  9.0 9.4  --   MG 1.7 1.8 1.8 1.6* 1.8  PHOS 3.0  --  3.4 3.7  --     ABG: Recent Labs  Lab 10/12/19 0835  PHART 7.432  PCO2ART 49.2*  PO2ART 91.4  HCO3 32.4*  O2SAT 98.2    Liver Function Tests: Recent Labs  Lab 10/10/19 0735  ALBUMIN 2.5*   No results for input(s): LIPASE, AMYLASE in the last 168 hours. No results for input(s): AMMONIA in the last 168 hours.  CBC: Recent Labs  Lab 10/10/19 0735 10/12/19 0618 10/14/19 0429  WBC 9.9 10.7* 15.8*  HGB 8.2* 8.8* 9.6*  HCT 27.5* 28.1*  30.1*  MCV 97.5 98.6 98.4  PLT 197 204 212    Cardiac Enzymes: No results for input(s): CKTOTAL, CKMB, CKMBINDEX, TROPONINI in the last 168 hours.  BNP (last 3 results) No results for input(s): BNP in the last 8760 hours.  ProBNP (last 3 results) No results for input(s): PROBNP in the last 8760 hours.  Radiological Exams: DG CHEST PORT 1 VIEW  Result Date: 10/14/2019 CLINICAL DATA:  Pneumonia EXAM: PORTABLE CHEST 1 VIEW COMPARISON:  10/10/2019 FINDINGS: Un tracheostomy is unchanged. Right internal jugular central venous catheter has been removed. Pulmonary insufflation is stable and symmetric. Superimposed diffuse airspace infiltrate, slightly more severe within the left lung, appears stable since prior examination. No pneumothorax or pleural effusion. Cardiac size within normal limits. Pulmonary vascularity is normal. IMPRESSION: Stable diffuse, slightly asymmetric pulmonary infiltrate. Electronically Signed   By: Helyn Numbers MD   On: 10/14/2019 06:37    Assessment/Plan Active Problems:   Acute on chronic respiratory failure with hypoxia (HCC)   COVID-19 virus infection   Pneumonia due to COVID-19 virus   Healthcare-associated pneumonia   Metabolic encephalopathy   1. Acute on chronic  respiratory failure with hypoxia we will continue with T-piece titrate oxygen continue pulmonary toilet. 2. COVID-19 virus infection in recovery phase 3. Pneumonia due to COVID-19 treated we will continue to follow 4. Healthcare associated pneumonia slow improvement 5. Metabolic encephalopathy no change   I have personally seen and evaluated the patient, evaluated laboratory and imaging results, formulated the assessment and plan and placed orders. The Patient requires high complexity decision making with multiple systems involvement.  Rounds were done with the Respiratory Therapy Director and Staff therapists and discussed with nursing staff also.  Yevonne Pax, MD [facility] Pulmonary Critical  Care Medicine Sleep Medicine

## 2019-10-16 DIAGNOSIS — J189 Pneumonia, unspecified organism: Secondary | ICD-10-CM | POA: Diagnosis not present

## 2019-10-16 DIAGNOSIS — G9341 Metabolic encephalopathy: Secondary | ICD-10-CM | POA: Diagnosis not present

## 2019-10-16 DIAGNOSIS — J9621 Acute and chronic respiratory failure with hypoxia: Secondary | ICD-10-CM | POA: Diagnosis not present

## 2019-10-16 DIAGNOSIS — U071 COVID-19: Secondary | ICD-10-CM | POA: Diagnosis not present

## 2019-10-16 NOTE — Progress Notes (Signed)
Pulmonary Critical Care Medicine [facility] [place]   PULMONARY CRITICAL CARE SERVICE  PROGRESS NOTE  Date of Service: 10/16/2019  Jill Navarro  UEA:[id number]  DOB: [birth year]   DOA: 10/03/2019  Referring Physician: Carron Curie, MD  HPI: Jill Navarro is a 64 y.o. female seen for follow up of Acute on Chronic Respiratory Failure.  Patient currently is on T collar has been on 28% FiO2 with good saturations.  Medications: Reviewed on Rounds  Physical Exam:  Vitals: Temperature is 98.2 pulse 91 respiratory rate 30 blood pressure is 144/66 saturations 91%  Ventilator Settings on T collar with an FiO2 of 28%  . General: Comfortable at this time . Eyes: Grossly normal lids, irises & conjunctiva . ENT: grossly tongue is normal . Neck: no obvious mass . Cardiovascular: S1 S2 normal no gallop . Respiratory: Coarse breath sounds with few scattered rhonchi . Abdomen: soft . Skin: no rash seen on limited exam . Musculoskeletal: not rigid . Psychiatric:unable to assess . Neurologic: no seizure no involuntary movements         Lab Data:   Basic Metabolic Panel: Recent Labs  Lab 10/10/19 0735 10/11/19 1236 10/12/19 0618 10/14/19 0429 10/15/19 1219  NA 142  --  140 139  --   K 4.1  --  4.3 4.0  --   CL 102  --  100 97*  --   CO2 30  --  30 29  --   GLUCOSE 115*  --  100* 98  --   BUN 42*  --  36* 29*  --   CREATININE 0.45  --  0.41* 0.46  --   CALCIUM 9.0  --  9.0 9.4  --   MG 1.7 1.8 1.8 1.6* 1.8  PHOS 3.0  --  3.4 3.7  --     ABG: Recent Labs  Lab 10/12/19 0835  PHART 7.432  PCO2ART 49.2*  PO2ART 91.4  HCO3 32.4*  O2SAT 98.2    Liver Function Tests: Recent Labs  Lab 10/10/19 0735  ALBUMIN 2.5*   No results for input(s): LIPASE, AMYLASE in the last 168 hours. No results for input(s): AMMONIA in the last 168 hours.  CBC: Recent Labs  Lab 10/10/19 0735 10/12/19 0618 10/14/19 0429  WBC 9.9 10.7* 15.8*  HGB 8.2* 8.8* 9.6*  HCT 27.5* 28.1*  30.1*  MCV 97.5 98.6 98.4  PLT 197 204 212    Cardiac Enzymes: No results for input(s): CKTOTAL, CKMB, CKMBINDEX, TROPONINI in the last 168 hours.  BNP (last 3 results) No results for input(s): BNP in the last 8760 hours.  ProBNP (last 3 results) No results for input(s): PROBNP in the last 8760 hours.  Radiological Exams: No results found.  Assessment/Plan Active Problems:   Acute on chronic respiratory failure with hypoxia (HCC)   COVID-19 virus infection   Pneumonia due to COVID-19 virus   Healthcare-associated pneumonia   Metabolic encephalopathy   1. Acute on chronic respiratory failure hypoxia we will continue with T collar patient is tolerating it well.  Will advance as tolerated. 2. COVID-19 virus infection in recovery phase we will continue to follow along. 3. Pneumonia due to COVID-19 at baseline 4. Healthcare associated pneumonia treated we will monitor 5. Metabolic encephalopathy no change   I have personally seen and evaluated the patient, evaluated laboratory and imaging results, formulated the assessment and plan and placed orders. The Patient requires high complexity decision making with multiple systems involvement.  Rounds were done with the Respiratory  Therapy Director and Staff therapists and discussed with nursing staff also.  Allyne Gee, MD [facility] Pulmonary Critical Care Medicine Sleep Medicine

## 2019-10-17 DIAGNOSIS — U071 COVID-19: Secondary | ICD-10-CM | POA: Diagnosis not present

## 2019-10-17 DIAGNOSIS — J9621 Acute and chronic respiratory failure with hypoxia: Secondary | ICD-10-CM | POA: Diagnosis not present

## 2019-10-17 DIAGNOSIS — G9341 Metabolic encephalopathy: Secondary | ICD-10-CM | POA: Diagnosis not present

## 2019-10-17 DIAGNOSIS — J189 Pneumonia, unspecified organism: Secondary | ICD-10-CM | POA: Diagnosis not present

## 2019-10-17 NOTE — Progress Notes (Signed)
Pulmonary Critical Care Medicine [facility] [place]   PULMONARY CRITICAL CARE SERVICE  PROGRESS NOTE  Date of Service: 10/17/2019  Jill Navarro  [id number]:[id number]  DOB: [birth year]   DOA: 10/03/2019  Referring Physician: Carron Curie, MD  HPI: Jill Navarro is a 64 y.o. female seen for follow up of Acute on Chronic Respiratory Failure. Doing well with the weaning right now is on T collar has been on 28% FiO2 with good saturations.  Secretions are improving slowly  Medications: Reviewed on Rounds  Physical Exam:  Vitals: Temperature 98.2 pulse 84 respiratory rate 22 blood pressure is 143/71 saturations 93%  Ventilator Settings on T collar FiO2 of 28%  . General: Comfortable at this time . Eyes: Grossly normal lids, irises & conjunctiva . ENT: grossly tongue is normal . Neck: no obvious mass . Cardiovascular: S1 S2 normal no gallop . Respiratory: Scattered rhonchi expansion is equal at this time . Abdomen: soft . Skin: no rash seen on limited exam . Musculoskeletal: not rigid . Psychiatric:unable to assess . Neurologic: no seizure no involuntary movements         Lab Data:   Basic Metabolic Panel: Recent Labs  Lab 10/11/19 1236 10/12/19 0618 10/14/19 0429 10/15/19 1219  NA  --  140 139  --   K  --  4.3 4.0  --   CL  --  100 97*  --   CO2  --  30 29  --   GLUCOSE  --  100* 98  --   BUN  --  36* 29*  --   CREATININE  --  0.41* 0.46  --   CALCIUM  --  9.0 9.4  --   MG 1.8 1.8 1.6* 1.8  PHOS  --  3.4 3.7  --     ABG: Recent Labs  Lab 10/12/19 0835  PHART 7.432  PCO2ART 49.2*  PO2ART 91.4  HCO3 32.4*  O2SAT 98.2    Liver Function Tests: No results for input(s): AST, ALT, ALKPHOS, BILITOT, PROT, ALBUMIN in the last 168 hours. No results for input(s): LIPASE, AMYLASE in the last 168 hours. No results for input(s): AMMONIA in the last 168 hours.  CBC: Recent Labs  Lab 10/12/19 0618 10/14/19 0429  WBC 10.7* 15.8*  HGB 8.8* 9.6*  HCT 28.1*  30.1*  MCV 98.6 98.4  PLT 204 212    Cardiac Enzymes: No results for input(s): CKTOTAL, CKMB, CKMBINDEX, TROPONINI in the last 168 hours.  BNP (last 3 results) No results for input(s): BNP in the last 8760 hours.  ProBNP (last 3 results) No results for input(s): PROBNP in the last 8760 hours.  Radiological Exams: No results found.  Assessment/Plan Active Problems:   Acute on chronic respiratory failure with hypoxia (HCC)   COVID-19 virus infection   Pneumonia due to COVID-19 virus   Healthcare-associated pneumonia   Metabolic encephalopathy   1. Acute on chronic respiratory failure hypoxia we will continue with T collar trials titrate oxygen continue pulmonary toilet. 2. COVID-19 virus infection in recovery 3. Pneumonia due to COVID-19 treated we will continue to monitor 4. Healthcare associated pneumonia slow improvement 5. Metabolic encephalopathy supportive care   I have personally seen and evaluated the patient, evaluated laboratory and imaging results, formulated the assessment and plan and placed orders. The Patient requires high complexity decision making with multiple systems involvement.  Rounds were done with the Respiratory Therapy Director and Staff therapists and discussed with nursing staff also.  Yevonne Pax, MD [facility]  Pulmonary Critical Care Medicine Sleep Medicine

## 2019-10-18 DIAGNOSIS — J9621 Acute and chronic respiratory failure with hypoxia: Secondary | ICD-10-CM | POA: Diagnosis not present

## 2019-10-18 DIAGNOSIS — G9341 Metabolic encephalopathy: Secondary | ICD-10-CM | POA: Diagnosis not present

## 2019-10-18 DIAGNOSIS — U071 COVID-19: Secondary | ICD-10-CM | POA: Diagnosis not present

## 2019-10-18 DIAGNOSIS — J189 Pneumonia, unspecified organism: Secondary | ICD-10-CM | POA: Diagnosis not present

## 2019-10-18 LAB — BASIC METABOLIC PANEL
Anion gap: 15 (ref 5–15)
BUN: 21 mg/dL (ref 8–23)
CO2: 28 mmol/L (ref 22–32)
Calcium: 9.3 mg/dL (ref 8.9–10.3)
Chloride: 91 mmol/L — ABNORMAL LOW (ref 98–111)
Creatinine, Ser: 0.42 mg/dL — ABNORMAL LOW (ref 0.44–1.00)
GFR calc Af Amer: >60 mL/min (ref >60)
GFR calc non Af Amer: >60 mL/min (ref >60)
Glucose, Bld: 128 mg/dL — ABNORMAL HIGH (ref 70–99)
Potassium: 4.2 mmol/L (ref 3.5–5.1)
Sodium: 134 mmol/L — ABNORMAL LOW (ref 135–145)

## 2019-10-18 LAB — MAGNESIUM: Magnesium: 1.8 mg/dL (ref 1.7–2.4)

## 2019-10-18 LAB — CBC
HCT: 28.9 % — ABNORMAL LOW (ref 36.0–46.0)
Hemoglobin: 9.3 g/dL — ABNORMAL LOW (ref 12.0–15.0)
MCH: 31.2 pg (ref 26.0–34.0)
MCHC: 32.2 g/dL (ref 30.0–36.0)
MCV: 97 fL (ref 80.0–100.0)
Platelets: 190 10*3/uL (ref 150–400)
RBC: 2.98 MIL/uL — ABNORMAL LOW (ref 3.87–5.11)
RDW: 20.2 % — ABNORMAL HIGH (ref 11.5–15.5)
WBC: 9.6 10*3/uL (ref 4.0–10.5)
nRBC: 0 % (ref 0.0–0.2)

## 2019-10-18 LAB — CULTURE, RESPIRATORY W GRAM STAIN

## 2019-10-18 NOTE — Progress Notes (Addendum)
Pulmonary Critical Care Medicine [facility] [place]   PULMONARY CRITICAL CARE SERVICE  PROGRESS NOTE  Date of Service: 10/18/2019  Gala Padovano  HEN:[id number]  DOB: 12-25-1955   DOA: 10/03/2019  Referring Physician: Carron Curie, MD  HPI: Rafael Quesada is a 64 y.o. female seen for follow up of Acute on Chronic Respiratory Failure.  Patient remains on 28% T-bar using PMV with no difficulty satting well no fever distress.  Medications: Reviewed on Rounds  Physical Exam:  Vitals: Pulse 82 respirations 22 BP 124/68 O2 sat 98% temp 97.9  Ventilator Settings 20% T-bar  . General: Comfortable at this time . Eyes: Grossly normal lids, irises & conjunctiva . ENT: grossly tongue is normal . Neck: no obvious mass . Cardiovascular: S1 S2 normal no gallop . Respiratory: No rales or rhonchi noted . Abdomen: soft . Skin: no rash seen on limited exam . Musculoskeletal: not rigid . Psychiatric:unable to assess . Neurologic: no seizure no involuntary movements         Lab Data:   Basic Metabolic Panel: Recent Labs  Lab 10/12/19 0618 10/14/19 0429 10/15/19 1219  NA 140 139  --   K 4.3 4.0  --   CL 100 97*  --   CO2 30 29  --   GLUCOSE 100* 98  --   BUN 36* 29*  --   CREATININE 0.41* 0.46  --   CALCIUM 9.0 9.4  --   MG 1.8 1.6* 1.8  PHOS 3.4 3.7  --     ABG: Recent Labs  Lab 10/12/19 0835  PHART 7.432  PCO2ART 49.2*  PO2ART 91.4  HCO3 32.4*  O2SAT 98.2    Liver Function Tests: No results for input(s): AST, ALT, ALKPHOS, BILITOT, PROT, ALBUMIN in the last 168 hours. No results for input(s): LIPASE, AMYLASE in the last 168 hours. No results for input(s): AMMONIA in the last 168 hours.  CBC: Recent Labs  Lab 10/12/19 0618 10/14/19 0429  WBC 10.7* 15.8*  HGB 8.8* 9.6*  HCT 28.1* 30.1*  MCV 98.6 98.4  PLT 204 212    Cardiac Enzymes: No results for input(s): CKTOTAL, CKMB, CKMBINDEX, TROPONINI in the last 168 hours.  BNP (last 3 results) No  results for input(s): BNP in the last 8760 hours.  ProBNP (last 3 results) No results for input(s): PROBNP in the last 8760 hours.  Radiological Exams: No results found.  Assessment/Plan Active Problems:   Acute on chronic respiratory failure with hypoxia (HCC)   COVID-19 virus infection   Pneumonia due to COVID-19 virus   Healthcare-associated pneumonia   Metabolic encephalopathy   1. Acute on chronic respiratory failure hypoxia we will continue with T collar trials titrate oxygen continue pulmonary toilet. 2. COVID-19 virus infection in recovery 3. Pneumonia due to COVID-19 treated we will continue to monitor 4. Healthcare associated pneumonia slow improvement 5. Metabolic encephalopathy supportive care   I have personally seen and evaluated the patient, evaluated laboratory and imaging results, formulated the assessment and plan and placed orders. The Patient requires high complexity decision making with multiple systems involvement.  Rounds were done with the Respiratory Therapy Director and Staff therapists and discussed with nursing staff also.  Yevonne Pax, MD [facility] Pulmonary Critical Care Medicine Sleep Medicine

## 2019-10-19 DIAGNOSIS — J189 Pneumonia, unspecified organism: Secondary | ICD-10-CM | POA: Diagnosis not present

## 2019-10-19 DIAGNOSIS — G9341 Metabolic encephalopathy: Secondary | ICD-10-CM | POA: Diagnosis not present

## 2019-10-19 DIAGNOSIS — J9621 Acute and chronic respiratory failure with hypoxia: Secondary | ICD-10-CM | POA: Diagnosis not present

## 2019-10-19 DIAGNOSIS — U071 COVID-19: Secondary | ICD-10-CM | POA: Diagnosis not present

## 2019-10-19 LAB — CBC
HCT: 31.4 % — ABNORMAL LOW (ref 36.0–46.0)
Hemoglobin: 9.8 g/dL — ABNORMAL LOW (ref 12.0–15.0)
MCH: 30.2 pg (ref 26.0–34.0)
MCHC: 31.2 g/dL (ref 30.0–36.0)
MCV: 96.6 fL (ref 80.0–100.0)
Platelets: 176 10*3/uL (ref 150–400)
RBC: 3.25 MIL/uL — ABNORMAL LOW (ref 3.87–5.11)
RDW: 19.9 % — ABNORMAL HIGH (ref 11.5–15.5)
WBC: 9.5 10*3/uL (ref 4.0–10.5)
nRBC: 0 % (ref 0.0–0.2)

## 2019-10-19 LAB — BASIC METABOLIC PANEL
Anion gap: 13 (ref 5–15)
BUN: 18 mg/dL (ref 8–23)
CO2: 30 mmol/L (ref 22–32)
Calcium: 9.8 mg/dL (ref 8.9–10.3)
Chloride: 92 mmol/L — ABNORMAL LOW (ref 98–111)
Creatinine, Ser: 0.43 mg/dL — ABNORMAL LOW (ref 0.44–1.00)
GFR calc Af Amer: >60 mL/min (ref >60)
GFR calc non Af Amer: >60 mL/min (ref >60)
Glucose, Bld: 94 mg/dL (ref 70–99)
Potassium: 4.1 mmol/L (ref 3.5–5.1)
Sodium: 135 mmol/L (ref 135–145)

## 2019-10-19 LAB — MAGNESIUM: Magnesium: 1.9 mg/dL (ref 1.7–2.4)

## 2019-10-19 NOTE — Progress Notes (Signed)
Pulmonary Critical Care Medicine [facility] [place]   PULMONARY CRITICAL CARE SERVICE  PROGRESS NOTE  Date of Service: 10/19/2019  Jill Navarro  QQI:[id number]  DOB: 20-Mar-1955   DOA: 10/03/2019  Referring Physician: Carron Curie, MD  HPI: Jill Navarro is a 64 y.o. female seen for follow up of Acute on Chronic Respiratory Failure.  Currently on T collar 28% FiO2 good saturations are noted  Medications: Reviewed on Rounds  Physical Exam:  Vitals: Temperature is 97.4 pulse 82 respiratory 23 blood pressure is 124/74  Ventilator Settings on T collar FiO2 20%  . General: Comfortable at this time . Eyes: Grossly normal lids, irises & conjunctiva . ENT: grossly tongue is normal . Neck: no obvious mass . Cardiovascular: S1 S2 normal no gallop . Respiratory: No rhonchi no rales are noted . Abdomen: soft . Skin: no rash seen on limited exam . Musculoskeletal: not rigid . Psychiatric:unable to assess . Neurologic: no seizure no involuntary movements         Lab Data:   Basic Metabolic Panel: Recent Labs  Lab 10/14/19 0429 10/15/19 1219 10/18/19 1445 10/19/19 0934  NA 139  --  134* 135  K 4.0  --  4.2 4.1  CL 97*  --  91* 92*  CO2 29  --  28 30  GLUCOSE 98  --  128* 94  BUN 29*  --  21 18  CREATININE 0.46  --  0.42* 0.43*  CALCIUM 9.4  --  9.3 9.8  MG 1.6* 1.8 1.8 1.9  PHOS 3.7  --   --   --     ABG: No results for input(s): PHART, PCO2ART, PO2ART, HCO3, O2SAT in the last 168 hours.  Liver Function Tests: No results for input(s): AST, ALT, ALKPHOS, BILITOT, PROT, ALBUMIN in the last 168 hours. No results for input(s): LIPASE, AMYLASE in the last 168 hours. No results for input(s): AMMONIA in the last 168 hours.  CBC: Recent Labs  Lab 10/14/19 0429 10/18/19 1445 10/19/19 0934  WBC 15.8* 9.6 9.5  HGB 9.6* 9.3* 9.8*  HCT 30.1* 28.9* 31.4*  MCV 98.4 97.0 96.6  PLT 212 190 176    Cardiac Enzymes: No results for input(s): CKTOTAL, CKMB,  CKMBINDEX, TROPONINI in the last 168 hours.  BNP (last 3 results) No results for input(s): BNP in the last 8760 hours.  ProBNP (last 3 results) No results for input(s): PROBNP in the last 8760 hours.  Radiological Exams: No results found.  Assessment/Plan Active Problems:   Acute on chronic respiratory failure with hypoxia (HCC)   COVID-19 virus infection   Pneumonia due to COVID-19 virus   Healthcare-associated pneumonia   Metabolic encephalopathy   1. Acute on chronic respiratory failure with hypoxia continue with T collar trials titrate oxygen continue pulmonary toilet 2. COVID-19 virus infection at baseline 3. Pneumonia due to COVID-19 slow improvement 4. Healthcare associated pneumonia also slow improvement we will monitor 5. Metabolic encephalopathy at baseline   I have personally seen and evaluated the patient, evaluated laboratory and imaging results, formulated the assessment and plan and placed orders. The Patient requires high complexity decision making with multiple systems involvement.  Rounds were done with the Respiratory Therapy Director and Staff therapists and discussed with nursing staff also.  Yevonne Pax, MD [facility] Pulmonary Critical Care Medicine Sleep Medicine

## 2019-10-20 DIAGNOSIS — J9621 Acute and chronic respiratory failure with hypoxia: Secondary | ICD-10-CM | POA: Diagnosis not present

## 2019-10-20 DIAGNOSIS — G9341 Metabolic encephalopathy: Secondary | ICD-10-CM | POA: Diagnosis not present

## 2019-10-20 DIAGNOSIS — J189 Pneumonia, unspecified organism: Secondary | ICD-10-CM | POA: Diagnosis not present

## 2019-10-20 DIAGNOSIS — U071 COVID-19: Secondary | ICD-10-CM | POA: Diagnosis not present

## 2019-10-20 LAB — VANCOMYCIN, TROUGH: Vancomycin Tr: 18 ug/mL (ref 15–20)

## 2019-10-20 NOTE — Progress Notes (Signed)
Pulmonary Critical Care Medicine [facility] [place]   PULMONARY CRITICAL CARE SERVICE  PROGRESS NOTE  Date of Service: 10/20/2019  Zetta Stoneman  [id number]:[id number]  DOB: 16-Feb-1955   DOA: 10/03/2019  Referring Physician: Carron Curie, MD  HPI: Lissett Favorite is a 64 y.o. female seen for follow up of Acute on Chronic Respiratory Failure.  Patient currently is on T collar has been on 20% FiO2 with good saturations.  Secretions are moderate  Medications: Reviewed on Rounds  Physical Exam:  Vitals: Temperature is 97.9 pulse 79 respiratory 30 blood pressure is 129/66 saturations 97%  Ventilator Settings on T collar FiO2 28%  . General: Comfortable at this time . Eyes: Grossly normal lids, irises & conjunctiva . ENT: grossly tongue is normal . Neck: no obvious mass . Cardiovascular: S1 S2 normal no gallop . Respiratory: No rhonchi very coarse breath sounds . Abdomen: soft . Skin: no rash seen on limited exam . Musculoskeletal: not rigid . Psychiatric:unable to assess . Neurologic: no seizure no involuntary movements         Lab Data:   Basic Metabolic Panel: Recent Labs  Lab 10/14/19 0429 10/15/19 1219 10/18/19 1445 10/19/19 0934  NA 139  --  134* 135  K 4.0  --  4.2 4.1  CL 97*  --  91* 92*  CO2 29  --  28 30  GLUCOSE 98  --  128* 94  BUN 29*  --  21 18  CREATININE 0.46  --  0.42* 0.43*  CALCIUM 9.4  --  9.3 9.8  MG 1.6* 1.8 1.8 1.9  PHOS 3.7  --   --   --     ABG: No results for input(s): PHART, PCO2ART, PO2ART, HCO3, O2SAT in the last 168 hours.  Liver Function Tests: No results for input(s): AST, ALT, ALKPHOS, BILITOT, PROT, ALBUMIN in the last 168 hours. No results for input(s): LIPASE, AMYLASE in the last 168 hours. No results for input(s): AMMONIA in the last 168 hours.  CBC: Recent Labs  Lab 10/14/19 0429 10/18/19 1445 10/19/19 0934  WBC 15.8* 9.6 9.5  HGB 9.6* 9.3* 9.8*  HCT 30.1* 28.9* 31.4*  MCV 98.4 97.0 96.6  PLT 212 190 176     Cardiac Enzymes: No results for input(s): CKTOTAL, CKMB, CKMBINDEX, TROPONINI in the last 168 hours.  BNP (last 3 results) No results for input(s): BNP in the last 8760 hours.  ProBNP (last 3 results) No results for input(s): PROBNP in the last 8760 hours.  Radiological Exams: No results found.  Assessment/Plan Active Problems:   Acute on chronic respiratory failure with hypoxia (HCC)   COVID-19 virus infection   Pneumonia due to COVID-19 virus   Healthcare-associated pneumonia   Metabolic encephalopathy   1. Acute on chronic respiratory failure hypoxia we will continue T collar trials currently on 28% FiO2 will continue secretion management supportive care. 2. COVID-19 virus infection in recovery phase 3. Pneumonia due to COVID-19 treated we will monitor 4. Healthcare associated pneumonia improving 5. Metabolic encephalopathy slow improvement   I have personally seen and evaluated the patient, evaluated laboratory and imaging results, formulated the assessment and plan and placed orders. The Patient requires high complexity decision making with multiple systems involvement.  Rounds were done with the Respiratory Therapy Director and Staff therapists and discussed with nursing staff also.  Yevonne Pax, MD [facility] Pulmonary Critical Care Medicine Sleep Medicine

## 2019-10-21 DIAGNOSIS — U071 COVID-19: Secondary | ICD-10-CM | POA: Diagnosis not present

## 2019-10-21 DIAGNOSIS — J189 Pneumonia, unspecified organism: Secondary | ICD-10-CM | POA: Diagnosis not present

## 2019-10-21 DIAGNOSIS — G9341 Metabolic encephalopathy: Secondary | ICD-10-CM | POA: Diagnosis not present

## 2019-10-21 DIAGNOSIS — J9621 Acute and chronic respiratory failure with hypoxia: Secondary | ICD-10-CM | POA: Diagnosis not present

## 2019-10-21 NOTE — Progress Notes (Signed)
Pulmonary Critical Care Medicine [facility] [place]   PULMONARY CRITICAL CARE SERVICE  PROGRESS NOTE  Date of Service: 10/21/2019  Catori Panozzo  TIW:[id number]  DOB: 30-Jan-1955   DOA: 10/03/2019  Referring Physician: Carron Curie, MD  HPI: Rachyl Wuebker is a 64 y.o. female seen for follow up of Acute on Chronic Respiratory Failure.  Patient currently is on T collar has been on 28% FiO2 good saturations are noted  Medications: Reviewed on Rounds  Physical Exam:  Vitals: Temperature is 98.2 pulse 79 respiratory 35 blood pressure is 129/66 saturations 97%  Ventilator Settings on T collar FiO2 of 28%  . General: Comfortable at this time . Eyes: Grossly normal lids, irises & conjunctiva . ENT: grossly tongue is normal . Neck: no obvious mass . Cardiovascular: S1 S2 normal no gallop . Respiratory: No rhonchi coarse breath sounds . Abdomen: soft . Skin: no rash seen on limited exam . Musculoskeletal: not rigid . Psychiatric:unable to assess . Neurologic: no seizure no involuntary movements         Lab Data:   Basic Metabolic Panel: Recent Labs  Lab 10/15/19 1219 10/18/19 1445 10/19/19 0934  NA  --  134* 135  K  --  4.2 4.1  CL  --  91* 92*  CO2  --  28 30  GLUCOSE  --  128* 94  BUN  --  21 18  CREATININE  --  0.42* 0.43*  CALCIUM  --  9.3 9.8  MG 1.8 1.8 1.9    ABG: No results for input(s): PHART, PCO2ART, PO2ART, HCO3, O2SAT in the last 168 hours.  Liver Function Tests: No results for input(s): AST, ALT, ALKPHOS, BILITOT, PROT, ALBUMIN in the last 168 hours. No results for input(s): LIPASE, AMYLASE in the last 168 hours. No results for input(s): AMMONIA in the last 168 hours.  CBC: Recent Labs  Lab 10/18/19 1445 10/19/19 0934  WBC 9.6 9.5  HGB 9.3* 9.8*  HCT 28.9* 31.4*  MCV 97.0 96.6  PLT 190 176    Cardiac Enzymes: No results for input(s): CKTOTAL, CKMB, CKMBINDEX, TROPONINI in the last 168 hours.  BNP (last 3 results) No results  for input(s): BNP in the last 8760 hours.  ProBNP (last 3 results) No results for input(s): PROBNP in the last 8760 hours.  Radiological Exams: No results found.  Assessment/Plan Active Problems:   Acute on chronic respiratory failure with hypoxia (HCC)   COVID-19 virus infection   Pneumonia due to COVID-19 virus   Healthcare-associated pneumonia   Metabolic encephalopathy   1. Acute on chronic respiratory failure hypoxia we will continue with T collar currently is on 28% FiO2 continue secretion management supportive care. 2. COVID-19 virus infection in recovery phase 3. Pneumonia due to COVID-19 treated we will continue to monitor 4. Healthcare associated pneumonia supportive care slow improvement 5. Metabolic encephalopathy unchanged   I have personally seen and evaluated the patient, evaluated laboratory and imaging results, formulated the assessment and plan and placed orders. The Patient requires high complexity decision making with multiple systems involvement.  Rounds were done with the Respiratory Therapy Director and Staff therapists and discussed with nursing staff also.  Yevonne Pax, MD [facility] Pulmonary Critical Care Medicine Sleep Medicine

## 2019-10-22 DIAGNOSIS — G9341 Metabolic encephalopathy: Secondary | ICD-10-CM | POA: Diagnosis not present

## 2019-10-22 DIAGNOSIS — U071 COVID-19: Secondary | ICD-10-CM | POA: Diagnosis not present

## 2019-10-22 DIAGNOSIS — J9621 Acute and chronic respiratory failure with hypoxia: Secondary | ICD-10-CM | POA: Diagnosis not present

## 2019-10-22 DIAGNOSIS — J189 Pneumonia, unspecified organism: Secondary | ICD-10-CM | POA: Diagnosis not present

## 2019-10-22 LAB — MAGNESIUM: Magnesium: 1.8 mg/dL (ref 1.7–2.4)

## 2019-10-22 LAB — CBC
HCT: 29.6 % — ABNORMAL LOW (ref 36.0–46.0)
Hemoglobin: 9.2 g/dL — ABNORMAL LOW (ref 12.0–15.0)
MCH: 30 pg (ref 26.0–34.0)
MCHC: 31.1 g/dL (ref 30.0–36.0)
MCV: 96.4 fL (ref 80.0–100.0)
Platelets: 187 10*3/uL (ref 150–400)
RBC: 3.07 MIL/uL — ABNORMAL LOW (ref 3.87–5.11)
RDW: 19.7 % — ABNORMAL HIGH (ref 11.5–15.5)
WBC: 6.4 10*3/uL (ref 4.0–10.5)
nRBC: 0 % (ref 0.0–0.2)

## 2019-10-22 LAB — BASIC METABOLIC PANEL
Anion gap: 12 (ref 5–15)
BUN: 13 mg/dL (ref 8–23)
CO2: 29 mmol/L (ref 22–32)
Calcium: 9.4 mg/dL (ref 8.9–10.3)
Chloride: 94 mmol/L — ABNORMAL LOW (ref 98–111)
Creatinine, Ser: 0.38 mg/dL — ABNORMAL LOW (ref 0.44–1.00)
GFR calc Af Amer: >60 mL/min (ref >60)
GFR calc non Af Amer: >60 mL/min (ref >60)
Glucose, Bld: 106 mg/dL — ABNORMAL HIGH (ref 70–99)
Potassium: 3.7 mmol/L (ref 3.5–5.1)
Sodium: 135 mmol/L (ref 135–145)

## 2019-10-22 LAB — PHOSPHORUS: Phosphorus: 4.1 mg/dL (ref 2.5–4.6)

## 2019-10-22 NOTE — Progress Notes (Addendum)
Pulmonary Critical Care Medicine [facility] [place]   PULMONARY CRITICAL CARE SERVICE  PROGRESS NOTE  Date of Service: 10/22/2019  Jill Navarro  [id number]:[id number]  DOB: 01-21-1956   DOA: 10/03/2019  Referring Physician: Carron Curie, MD  HPI: Jill Navarro is a 64 y.o. female seen for follow up of Acute on Chronic Respiratory Failure.  Patient is currently doing capping on room air satting well no distress.  Medications: Reviewed on Rounds  Physical Exam:  Vitals: Pulse 79 respirations 26 BP 133/72 O2 sat 95% temp 96.8  Ventilator Settings 28% ATC  . General: Comfortable at this time . Eyes: Grossly normal lids, irises & conjunctiva . ENT: grossly tongue is normal . Neck: no obvious mass . Cardiovascular: S1 S2 normal no gallop . Respiratory: No rales or rhonchi noted . Abdomen: soft . Skin: no rash seen on limited exam . Musculoskeletal: not rigid . Psychiatric:unable to assess . Neurologic: no seizure no involuntary movements         Lab Data:   Basic Metabolic Panel: Recent Labs  Lab 10/15/19 1219 10/18/19 1445 10/19/19 0934 10/22/19 0838  NA  --  134* 135 135  K  --  4.2 4.1 3.7  CL  --  91* 92* 94*  CO2  --  28 30 29   GLUCOSE  --  128* 94 106*  BUN  --  21 18 13   CREATININE  --  0.42* 0.43* 0.38*  CALCIUM  --  9.3 9.8 9.4  MG 1.8 1.8 1.9 1.8  PHOS  --   --   --  4.1    ABG: No results for input(s): PHART, PCO2ART, PO2ART, HCO3, O2SAT in the last 168 hours.  Liver Function Tests: No results for input(s): AST, ALT, ALKPHOS, BILITOT, PROT, ALBUMIN in the last 168 hours. No results for input(s): LIPASE, AMYLASE in the last 168 hours. No results for input(s): AMMONIA in the last 168 hours.  CBC: Recent Labs  Lab 10/18/19 1445 10/19/19 0934 10/22/19 0838  WBC 9.6 9.5 6.4  HGB 9.3* 9.8* 9.2*  HCT 28.9* 31.4* 29.6*  MCV 97.0 96.6 96.4  PLT 190 176 187    Cardiac Enzymes: No results for input(s): CKTOTAL, CKMB, CKMBINDEX, TROPONINI in  the last 168 hours.  BNP (last 3 results) No results for input(s): BNP in the last 8760 hours.  ProBNP (last 3 results) No results for input(s): PROBNP in the last 8760 hours.  Radiological Exams: No results found.  Assessment/Plan Active Problems:   Acute on chronic respiratory failure with hypoxia (HCC)   COVID-19 virus infection   Pneumonia due to COVID-19 virus   Healthcare-associated pneumonia   Metabolic encephalopathy   1. Acute on chronic respiratory failure hypoxia we will continue with T collar currently is on 28% FiO2 continue secretion management supportive care. 2. COVID-19 virus infection in recovery phase 3. Pneumonia due to COVID-19 treated we will continue to monitor 4. Healthcare associated pneumonia supportive care slow improvement 5. Metabolic encephalopathy unchanged   I have personally seen and evaluated the patient, evaluated laboratory and imaging results, formulated the assessment and plan and placed orders. The Patient requires high complexity decision making with multiple systems involvement.  Rounds were done with the Respiratory Therapy Director and Staff therapists and discussed with nursing staff also.  10/21/19, MD [facility] Pulmonary Critical Care Medicine Sleep Medicine

## 2019-10-23 ENCOUNTER — Other Ambulatory Visit (HOSPITAL_COMMUNITY): Payer: 59

## 2019-10-23 DIAGNOSIS — G9341 Metabolic encephalopathy: Secondary | ICD-10-CM | POA: Diagnosis not present

## 2019-10-23 DIAGNOSIS — J9621 Acute and chronic respiratory failure with hypoxia: Secondary | ICD-10-CM | POA: Diagnosis not present

## 2019-10-23 DIAGNOSIS — J189 Pneumonia, unspecified organism: Secondary | ICD-10-CM | POA: Diagnosis not present

## 2019-10-23 DIAGNOSIS — U071 COVID-19: Secondary | ICD-10-CM | POA: Diagnosis not present

## 2019-10-23 LAB — BASIC METABOLIC PANEL
Anion gap: 12 (ref 5–15)
BUN: 12 mg/dL (ref 8–23)
CO2: 28 mmol/L (ref 22–32)
Calcium: 9.6 mg/dL (ref 8.9–10.3)
Chloride: 94 mmol/L — ABNORMAL LOW (ref 98–111)
Creatinine, Ser: 0.39 mg/dL — ABNORMAL LOW (ref 0.44–1.00)
GFR calc Af Amer: >60 mL/min (ref >60)
GFR calc non Af Amer: >60 mL/min (ref >60)
Glucose, Bld: 110 mg/dL — ABNORMAL HIGH (ref 70–99)
Potassium: 3.7 mmol/L (ref 3.5–5.1)
Sodium: 134 mmol/L — ABNORMAL LOW (ref 135–145)

## 2019-10-23 NOTE — Progress Notes (Addendum)
Pulmonary Critical Care Medicine [facility] [place]   PULMONARY CRITICAL CARE SERVICE  PROGRESS NOTE  Date of Service: 10/23/2019  Jill Navarro  [id number]:[id number]  DOB: 1955/12/17   DOA: 10/03/2019  Referring Physician: Carron Curie, MD  HPI: Jill Navarro is a 64 y.o. female seen for follow up of Acute on Chronic Respiratory Failure. Patient has now been capped for 24 hours on 1 L satting well no distress.  Medications: Reviewed on Rounds  Physical Exam:  Vitals: Pulse 77 respirations 20 BP 125/67 O2 sat 98% temp 97.7  Ventilator Settings 1 L nasal cannula  . General: Comfortable at this time . Eyes: Grossly normal lids, irises & conjunctiva . ENT: grossly tongue is normal . Neck: no obvious mass . Cardiovascular: S1 S2 normal no gallop . Respiratory: No rales or rhonchi noted . Abdomen: soft . Skin: no rash seen on limited exam . Musculoskeletal: not rigid . Psychiatric:unable to assess . Neurologic: no seizure no involuntary movements         Lab Data:   Basic Metabolic Panel: Recent Labs  Lab 10/18/19 1445 10/19/19 0934 10/22/19 0838  NA 134* 135 135  K 4.2 4.1 3.7  CL 91* 92* 94*  CO2 28 30 29   GLUCOSE 128* 94 106*  BUN 21 18 13   CREATININE 0.42* 0.43* 0.38*  CALCIUM 9.3 9.8 9.4  MG 1.8 1.9 1.8  PHOS  --   --  4.1    ABG: No results for input(s): PHART, PCO2ART, PO2ART, HCO3, O2SAT in the last 168 hours.  Liver Function Tests: No results for input(s): AST, ALT, ALKPHOS, BILITOT, PROT, ALBUMIN in the last 168 hours. No results for input(s): LIPASE, AMYLASE in the last 168 hours. No results for input(s): AMMONIA in the last 168 hours.  CBC: Recent Labs  Lab 10/18/19 1445 10/19/19 0934 10/22/19 0838  WBC 9.6 9.5 6.4  HGB 9.3* 9.8* 9.2*  HCT 28.9* 31.4* 29.6*  MCV 97.0 96.6 96.4  PLT 190 176 187    Cardiac Enzymes: No results for input(s): CKTOTAL, CKMB, CKMBINDEX, TROPONINI in the last 168 hours.  BNP (last 3 results) No  results for input(s): BNP in the last 8760 hours.  ProBNP (last 3 results) No results for input(s): PROBNP in the last 8760 hours.  Radiological Exams: No results found.  Assessment/Plan Active Problems:   Acute on chronic respiratory failure with hypoxia (HCC)   COVID-19 virus infection   Pneumonia due to COVID-19 virus   Healthcare-associated pneumonia   Metabolic encephalopathy   1. Acute on chronic respiratory failure hypoxia patient is now capped on 1 L satting well we'll continue aggressive pulmonary toilet supportive measures. 2. COVID-19 virus infection in recovery phase 3. Pneumonia due to COVID-19 treated we will continue to monitor 4. Healthcare associated pneumonia supportive care slow improvement 5. Metabolic encephalopathy unchanged   I have personally seen and evaluated the patient, evaluated laboratory and imaging results, formulated the assessment and plan and placed orders. The Patient requires high complexity decision making with multiple systems involvement.  Rounds were done with the Respiratory Therapy Director and Staff therapists and discussed with nursing staff also.  10/21/19, MD [facility] Pulmonary Critical Care Medicine Sleep Medicine

## 2019-10-24 DIAGNOSIS — J9621 Acute and chronic respiratory failure with hypoxia: Secondary | ICD-10-CM | POA: Diagnosis not present

## 2019-10-24 DIAGNOSIS — U071 COVID-19: Secondary | ICD-10-CM | POA: Diagnosis not present

## 2019-10-24 DIAGNOSIS — J189 Pneumonia, unspecified organism: Secondary | ICD-10-CM | POA: Diagnosis not present

## 2019-10-24 DIAGNOSIS — G9341 Metabolic encephalopathy: Secondary | ICD-10-CM | POA: Diagnosis not present

## 2019-10-24 LAB — VANCOMYCIN, TROUGH: Vancomycin Tr: 20 ug/mL (ref 15–20)

## 2019-10-24 NOTE — Progress Notes (Addendum)
Pulmonary Critical Care Medicine [facility] [place]   PULMONARY CRITICAL CARE SERVICE  PROGRESS NOTE  Date of Service: 10/24/2019  [name]  [id number]:[id number]  DOB: [birth year]   DOA: 10/03/2019  Referring Physician: Carron Curie, MD  HPI: Jill Navarro is a 64 y.o. female seen for follow up of Acute on Chronic Respiratory Failure.  Patient remains capped on 1 L has a 48-hour goal currently satting well no distress.  Medications: Reviewed on Rounds  Physical Exam:  Vitals: Pulse 82 respirations 22 BP 125/64 O2 sat 97% temp 97.5  Ventilator Settings currently capped on 1 L  . General: Comfortable at this time . Eyes: Grossly normal lids, irises & conjunctiva . ENT: grossly tongue is normal . Neck: no obvious mass . Cardiovascular: S1 S2 normal no gallop . Respiratory: No rales or rhonchi noted . Abdomen: soft . Skin: no rash seen on limited exam . Musculoskeletal: not rigid . Psychiatric:unable to assess . Neurologic: no seizure no involuntary movements         Lab Data:   Basic Metabolic Panel: Recent Labs  Lab 10/18/19 1445 10/19/19 0934 10/22/19 0838 10/23/19 1106  NA 134* 135 135 134*  K 4.2 4.1 3.7 3.7  CL 91* 92* 94* 94*  CO2 28 30 29 28   GLUCOSE 128* 94 106* 110*  BUN 21 18 13 12   CREATININE 0.42* 0.43* 0.38* 0.39*  CALCIUM 9.3 9.8 9.4 9.6  MG 1.8 1.9 1.8  --   PHOS  --   --  4.1  --     ABG: No results for input(s): PHART, PCO2ART, PO2ART, HCO3, O2SAT in the last 168 hours.  Liver Function Tests: No results for input(s): AST, ALT, ALKPHOS, BILITOT, PROT, ALBUMIN in the last 168 hours. No results for input(s): LIPASE, AMYLASE in the last 168 hours. No results for input(s): AMMONIA in the last 168 hours.  CBC: Recent Labs  Lab 10/18/19 1445 10/19/19 0934 10/22/19 0838  WBC 9.6 9.5 6.4  HGB 9.3* 9.8* 9.2*  HCT 28.9* 31.4* 29.6*  MCV 97.0 96.6 96.4  PLT 190 176 187    Cardiac Enzymes: No results for input(s): CKTOTAL, CKMB,  CKMBINDEX, TROPONINI in the last 168 hours.  BNP (last 3 results) No results for input(s): BNP in the last 8760 hours.  ProBNP (last 3 results) No results for input(s): PROBNP in the last 8760 hours.  Radiological Exams: No results found.  Assessment/Plan Active Problems:   Acute on chronic respiratory failure with hypoxia (HCC)   COVID-19 virus infection   Pneumonia due to COVID-19 virus   Healthcare-associated pneumonia   Metabolic encephalopathy   1. Acute on chronic respiratory failure hypoxia patient is now capped on 1 L satting well we'll continue aggressive pulmonary toilet supportive measures. 2. COVID-19 virus infection in recovery phase 3. Pneumonia due to COVID-19 treated we will continue to monitor 4. Healthcare associated pneumonia supportive care slow improvement 5. Metabolic encephalopathy unchanged   I have personally seen and evaluated the patient, evaluated laboratory and imaging results, formulated the assessment and plan and placed orders. The Patient requires high complexity decision making with multiple systems involvement.  Rounds were done with the Respiratory Therapy Director and Staff therapists and discussed with nursing staff also.  10/21/19, MD [facility] Pulmonary Critical Care Medicine Sleep Medicine

## 2019-10-25 ENCOUNTER — Other Ambulatory Visit (HOSPITAL_COMMUNITY): Payer: 59

## 2019-10-25 DIAGNOSIS — U071 COVID-19: Secondary | ICD-10-CM | POA: Diagnosis not present

## 2019-10-25 DIAGNOSIS — G9341 Metabolic encephalopathy: Secondary | ICD-10-CM | POA: Diagnosis not present

## 2019-10-25 DIAGNOSIS — J9621 Acute and chronic respiratory failure with hypoxia: Secondary | ICD-10-CM | POA: Diagnosis not present

## 2019-10-25 DIAGNOSIS — J189 Pneumonia, unspecified organism: Secondary | ICD-10-CM | POA: Diagnosis not present

## 2019-10-25 LAB — CBC
HCT: 30.3 % — ABNORMAL LOW (ref 36.0–46.0)
Hemoglobin: 9.5 g/dL — ABNORMAL LOW (ref 12.0–15.0)
MCH: 30.6 pg (ref 26.0–34.0)
MCHC: 31.4 g/dL (ref 30.0–36.0)
MCV: 97.7 fL (ref 80.0–100.0)
Platelets: 173 10*3/uL (ref 150–400)
RBC: 3.1 MIL/uL — ABNORMAL LOW (ref 3.87–5.11)
RDW: 19.5 % — ABNORMAL HIGH (ref 11.5–15.5)
WBC: 6.3 10*3/uL (ref 4.0–10.5)
nRBC: 0 % (ref 0.0–0.2)

## 2019-10-25 LAB — BASIC METABOLIC PANEL
Anion gap: 12 (ref 5–15)
BUN: 11 mg/dL (ref 8–23)
CO2: 30 mmol/L (ref 22–32)
Calcium: 9.5 mg/dL (ref 8.9–10.3)
Chloride: 96 mmol/L — ABNORMAL LOW (ref 98–111)
Creatinine, Ser: 0.34 mg/dL — ABNORMAL LOW (ref 0.44–1.00)
GFR calc Af Amer: >60 mL/min (ref >60)
GFR calc non Af Amer: >60 mL/min (ref >60)
Glucose, Bld: 118 mg/dL — ABNORMAL HIGH (ref 70–99)
Potassium: 3.4 mmol/L — ABNORMAL LOW (ref 3.5–5.1)
Sodium: 138 mmol/L (ref 135–145)

## 2019-10-25 LAB — PHOSPHORUS: Phosphorus: 5.2 mg/dL — ABNORMAL HIGH (ref 2.5–4.6)

## 2019-10-25 LAB — MAGNESIUM: Magnesium: 1.6 mg/dL — ABNORMAL LOW (ref 1.7–2.4)

## 2019-10-25 NOTE — Progress Notes (Addendum)
Pulmonary Critical Care Medicine [facility] [place]   PULMONARY CRITICAL CARE SERVICE  PROGRESS NOTE  Date of Service: 10/25/2019  Jill Navarro  WLN:[id number]  DOB: [birth year]   DOA: 10/03/2019  Referring Physician: Carron Curie, MD  HPI: Jill Navarro is a 64 y.o. female seen for follow up of Acute on Chronic Respiratory Failure.  Patient was decannulated this morning remains on 1 L nasal cannula at this time satting well no distress.  Medications: Reviewed on Rounds  Physical Exam:  Vitals: Pulse 80 respirations 19 BP 115/74 O2 sat 98% temp 97.5  Ventilator Settings 1 L nasal cannula  . General: Comfortable at this time . Eyes: Grossly normal lids, irises & conjunctiva . ENT: grossly tongue is normal . Neck: no obvious mass . Cardiovascular: S1 S2 normal no gallop . Respiratory: No rales or rhonchi noted . Abdomen: soft . Skin: no rash seen on limited exam . Musculoskeletal: not rigid . Psychiatric:unable to assess . Neurologic: no seizure no involuntary movements         Lab Data:   Basic Metabolic Panel: Recent Labs  Lab 10/18/19 1445 10/19/19 0934 10/22/19 0838 10/23/19 1106 10/25/19 0804  NA 134* 135 135 134* 138  K 4.2 4.1 3.7 3.7 3.4*  CL 91* 92* 94* 94* 96*  CO2 28 30 29 28 30   GLUCOSE 128* 94 106* 110* 118*  BUN 21 18 13 12 11   CREATININE 0.42* 0.43* 0.38* 0.39* 0.34*  CALCIUM 9.3 9.8 9.4 9.6 9.5  MG 1.8 1.9 1.8  --  1.6*  PHOS  --   --  4.1  --  5.2*    ABG: No results for input(s): PHART, PCO2ART, PO2ART, HCO3, O2SAT in the last 168 hours.  Liver Function Tests: No results for input(s): AST, ALT, ALKPHOS, BILITOT, PROT, ALBUMIN in the last 168 hours. No results for input(s): LIPASE, AMYLASE in the last 168 hours. No results for input(s): AMMONIA in the last 168 hours.  CBC: Recent Labs  Lab 10/18/19 1445 10/19/19 0934 10/22/19 0838 10/25/19 0804  WBC 9.6 9.5 6.4 6.3  HGB 9.3* 9.8* 9.2* 9.5*  HCT 28.9* 31.4* 29.6* 30.3*   MCV 97.0 96.6 96.4 97.7  PLT 190 176 187 173    Cardiac Enzymes: No results for input(s): CKTOTAL, CKMB, CKMBINDEX, TROPONINI in the last 168 hours.  BNP (last 3 results) No results for input(s): BNP in the last 8760 hours.  ProBNP (last 3 results) No results for input(s): PROBNP in the last 8760 hours.  Radiological Exams: DG Chest Port 1 View  Result Date: 10/25/2019 CLINICAL DATA:  In patient with pneumonia EXAM: PORTABLE CHEST 1 VIEW COMPARISON:  10/14/2019 FINDINGS: Cardiomegaly. Patchy interstitial and airspace opacity which is unchanged. Tracheostomy tube in place. No visible effusion or pneumothorax. IMPRESSION: Stable infiltrates and inflation. Electronically Signed   By: 12/25/2019 M.D.   On: 10/25/2019 05:25    Assessment/Plan Active Problems:   Acute on chronic respiratory failure with hypoxia (HCC)   COVID-19 virus infection   Pneumonia due to COVID-19 virus   Healthcare-associated pneumonia   Metabolic encephalopathy   1. Acute on chronic respiratory failure hypoxiapatient was decannulated this morning remains on 1 L nasal cannula satting well continue supportive measures. 2. COVID-19 virus infection in recovery phase 3. Pneumonia due to COVID-19 treated we will continue to monitor 4. Healthcare associated pneumonia supportive care slow improvement 5. Metabolic encephalopathy unchanged   I have personally seen and evaluated the patient, evaluated laboratory and imaging results,  formulated the assessment and plan and placed orders. The Patient requires high complexity decision making with multiple systems involvement.  Rounds were done with the Respiratory Therapy Director and Staff therapists and discussed with nursing staff also.  Allyne Gee, MD [facility] Pulmonary Critical Care Medicine Sleep Medicine

## 2019-10-26 DIAGNOSIS — U071 COVID-19: Secondary | ICD-10-CM | POA: Diagnosis not present

## 2019-10-26 DIAGNOSIS — J189 Pneumonia, unspecified organism: Secondary | ICD-10-CM | POA: Diagnosis not present

## 2019-10-26 DIAGNOSIS — J9621 Acute and chronic respiratory failure with hypoxia: Secondary | ICD-10-CM | POA: Diagnosis not present

## 2019-10-26 DIAGNOSIS — G9341 Metabolic encephalopathy: Secondary | ICD-10-CM | POA: Diagnosis not present

## 2019-10-26 LAB — BASIC METABOLIC PANEL
Anion gap: 13 (ref 5–15)
BUN: 14 mg/dL (ref 8–23)
CO2: 27 mmol/L (ref 22–32)
Calcium: 9.5 mg/dL (ref 8.9–10.3)
Chloride: 94 mmol/L — ABNORMAL LOW (ref 98–111)
Creatinine, Ser: 0.43 mg/dL — ABNORMAL LOW (ref 0.44–1.00)
GFR calc Af Amer: >60 mL/min (ref >60)
GFR calc non Af Amer: >60 mL/min (ref >60)
Glucose, Bld: 105 mg/dL — ABNORMAL HIGH (ref 70–99)
Potassium: 4.3 mmol/L (ref 3.5–5.1)
Sodium: 134 mmol/L — ABNORMAL LOW (ref 135–145)

## 2019-10-26 LAB — MAGNESIUM: Magnesium: 1.9 mg/dL (ref 1.7–2.4)

## 2019-10-26 LAB — PHOSPHORUS: Phosphorus: 4.7 mg/dL — ABNORMAL HIGH (ref 2.5–4.6)

## 2019-10-26 NOTE — Progress Notes (Signed)
Pulmonary Critical Care Medicine [facility] [place]   PULMONARY CRITICAL CARE SERVICE  PROGRESS NOTE  Date of Service: 10/26/2019  Jill Navarro  FOY:[id number]  DOB: 1955/07/18   DOA: 10/03/2019  Referring Physician: Carron Curie, MD  HPI: Jill Navarro is a 64 y.o. female seen for follow up of Acute on Chronic Respiratory Failure.  Patient is doing well after decannulation no issues are noted  Medications: Reviewed on Rounds  Physical Exam:  Vitals: Temperature is 96.7 pulse 79 respiratory rate 22 blood pressure is 139/73 saturations 100%  Ventilator Settings decannulated   General: Comfortable at this time  Eyes: Grossly normal lids, irises & conjunctiva  ENT: grossly tongue is normal  Neck: no obvious mass  Cardiovascular: S1 S2 normal no gallop  Respiratory: Scattered rhonchi no rales  Abdomen: soft  Skin: no rash seen on limited exam  Musculoskeletal: not rigid  Psychiatric:unable to assess  Neurologic: no seizure no involuntary movements         Lab Data:   Basic Metabolic Panel: Recent Labs  Lab 10/22/19 0838 10/23/19 1106 10/25/19 0804 10/26/19 0635  NA 135 134* 138 134*  K 3.7 3.7 3.4* 4.3  CL 94* 94* 96* 94*  CO2 29 28 30 27   GLUCOSE 106* 110* 118* 105*  BUN 13 12 11 14   CREATININE 0.38* 0.39* 0.34* 0.43*  CALCIUM 9.4 9.6 9.5 9.5  MG 1.8  --  1.6* 1.9  PHOS 4.1  --  5.2* 4.7*    ABG: No results for input(s): PHART, PCO2ART, PO2ART, HCO3, O2SAT in the last 168 hours.  Liver Function Tests: No results for input(s): AST, ALT, ALKPHOS, BILITOT, PROT, ALBUMIN in the last 168 hours. No results for input(s): LIPASE, AMYLASE in the last 168 hours. No results for input(s): AMMONIA in the last 168 hours.  CBC: Recent Labs  Lab 10/22/19 0838 10/25/19 0804  WBC 6.4 6.3  HGB 9.2* 9.5*  HCT 29.6* 30.3*  MCV 96.4 97.7  PLT 187 173    Cardiac Enzymes: No results for input(s): CKTOTAL, CKMB, CKMBINDEX, TROPONINI in the last  168 hours.  BNP (last 3 results) No results for input(s): BNP in the last 8760 hours.  ProBNP (last 3 results) No results for input(s): PROBNP in the last 8760 hours.  Radiological Exams: DG Chest Port 1 View  Result Date: 10/25/2019 CLINICAL DATA:  In patient with pneumonia EXAM: PORTABLE CHEST 1 VIEW COMPARISON:  10/14/2019 FINDINGS: Cardiomegaly. Patchy interstitial and airspace opacity which is unchanged. Tracheostomy tube in place. No visible effusion or pneumothorax. IMPRESSION: Stable infiltrates and inflation. Electronically Signed   By: 12/25/2019 M.D.   On: 10/25/2019 05:25    Assessment/Plan Active Problems:   Acute on chronic respiratory failure with hypoxia (HCC)   COVID-19 virus infection   Pneumonia due to COVID-19 virus   Healthcare-associated pneumonia   Metabolic encephalopathy   1. Acute on chronic respiratory failure with hypoxia we will continue with supportive care oxygen therapy as necessary 2. COVID-19 virus infection resolved 3. Pneumonia due to COVID-19 improving 4. Healthcare associated pneumonia slow improvement still with residual changes likely secondary to the COVID-19 5. Metabolic encephalopathy improved   I have personally seen and evaluated the patient, evaluated laboratory and imaging results, formulated the assessment and plan and placed orders. The Patient requires high complexity decision making with multiple systems involvement.  Rounds were done with the Respiratory Therapy Director and Staff therapists and discussed with nursing staff also.  Marnee Spring, MD [facility]  Pulmonary Critical Care Medicine Sleep Medicine

## 2019-10-28 LAB — NOVEL CORONAVIRUS, NAA (HOSP ORDER, SEND-OUT TO REF LAB; TAT 18-24 HRS): SARS-CoV-2, NAA: NOT DETECTED

## 2019-10-30 LAB — CBC
HCT: 33.5 % — ABNORMAL LOW (ref 36.0–46.0)
Hemoglobin: 11 g/dL — ABNORMAL LOW (ref 12.0–15.0)
MCH: 31.3 pg (ref 26.0–34.0)
MCHC: 32.8 g/dL (ref 30.0–36.0)
MCV: 95.2 fL (ref 80.0–100.0)
Platelets: 241 10*3/uL (ref 150–400)
RBC: 3.52 MIL/uL — ABNORMAL LOW (ref 3.87–5.11)
RDW: 18.6 % — ABNORMAL HIGH (ref 11.5–15.5)
WBC: 7.9 10*3/uL (ref 4.0–10.5)
nRBC: 0 % (ref 0.0–0.2)

## 2019-10-30 LAB — MAGNESIUM: Magnesium: 2 mg/dL (ref 1.7–2.4)

## 2019-10-30 LAB — RENAL FUNCTION PANEL
Albumin: 3.5 g/dL (ref 3.5–5.0)
Anion gap: 13 (ref 5–15)
BUN: 18 mg/dL (ref 8–23)
CO2: 29 mmol/L (ref 22–32)
Calcium: 10 mg/dL (ref 8.9–10.3)
Chloride: 89 mmol/L — ABNORMAL LOW (ref 98–111)
Creatinine, Ser: 0.53 mg/dL (ref 0.44–1.00)
GFR calc non Af Amer: >60 mL/min (ref >60)
Glucose, Bld: 110 mg/dL — ABNORMAL HIGH (ref 70–99)
Phosphorus: 4.9 mg/dL — ABNORMAL HIGH (ref 2.5–4.6)
Potassium: 4.1 mmol/L (ref 3.5–5.1)
Sodium: 131 mmol/L — ABNORMAL LOW (ref 135–145)

## 2019-10-31 ENCOUNTER — Other Ambulatory Visit (HOSPITAL_COMMUNITY): Payer: 59

## 2019-11-01 LAB — BASIC METABOLIC PANEL
Anion gap: 10 (ref 5–15)
BUN: 12 mg/dL (ref 8–23)
CO2: 27 mmol/L (ref 22–32)
Calcium: 9.2 mg/dL (ref 8.9–10.3)
Chloride: 95 mmol/L — ABNORMAL LOW (ref 98–111)
Creatinine, Ser: 0.45 mg/dL (ref 0.44–1.00)
GFR calc non Af Amer: >60 mL/min (ref >60)
Glucose, Bld: 103 mg/dL — ABNORMAL HIGH (ref 70–99)
Potassium: 3.7 mmol/L (ref 3.5–5.1)
Sodium: 132 mmol/L — ABNORMAL LOW (ref 135–145)

## 2022-05-04 IMAGING — DX DG CHEST 1V PORT
1 series · 1 of 1 positions shown · non-contrast
Comparison: October 02, 2019

CLINICAL DATA: Respiratory failure

EXAM:
PORTABLE CHEST 1 VIEW

[chest ap]
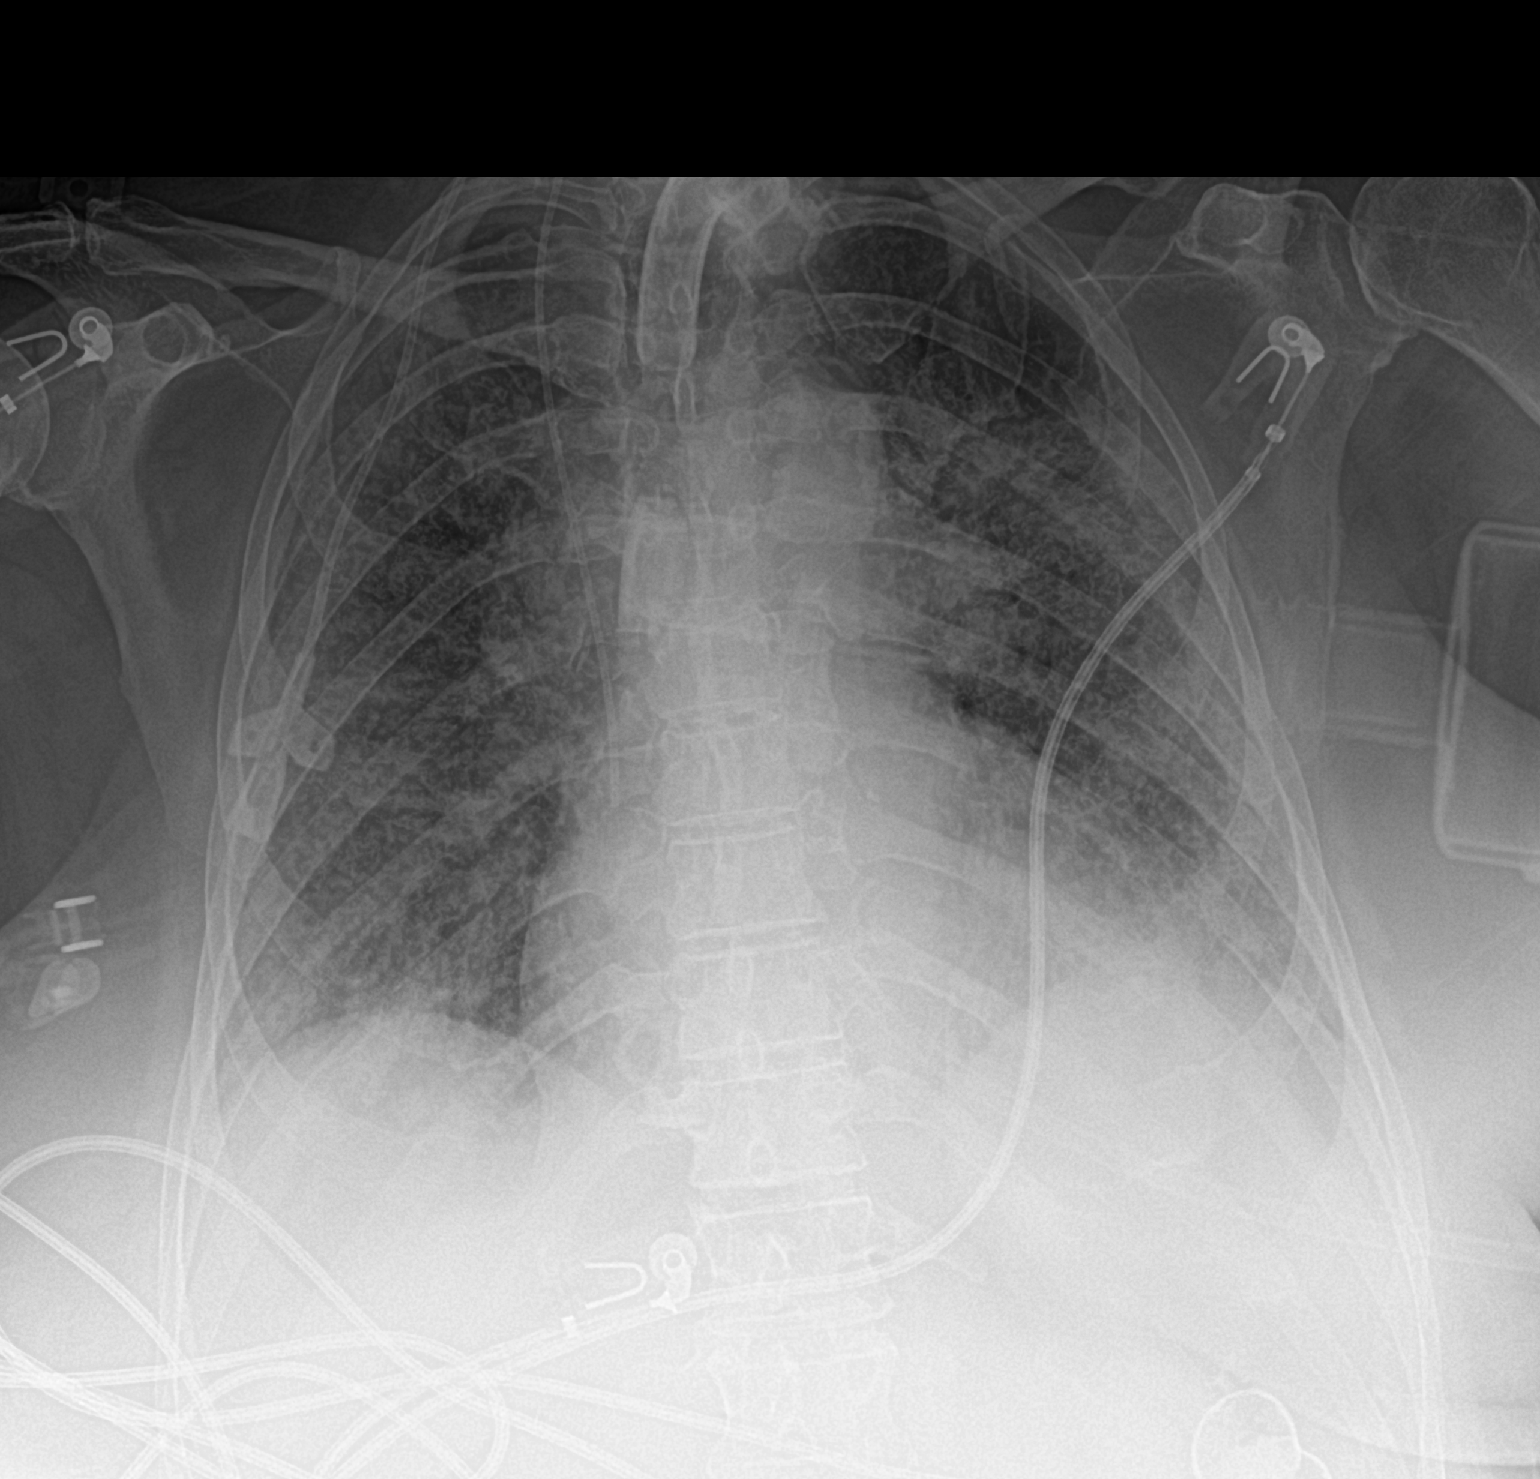

[1 of 1 positions shown; findings below may reference images not displayed]

FINDINGS: Tracheostomy catheter tip is 4.9 cm above the carina. Central
catheter tip is in the superior vena cava near the cavoatrial
junction, stable. No pneumothorax. Widespread patchy interstitial
and airspace opacity is present with slightly less airspace opacity
bilaterally compared to 2 days prior. No new opacity evident. Heart
size and pulmonary vascularity normal. No adenopathy. No bone
lesions.
IMPRESSION: Tube and catheter positions as described without pneumothorax.
Multifocal interstitial and airspace opacity present with overall
less opacity bilaterally compared to 2 days prior. No new opacities
are evident. Stable cardiac silhouette.

## 2022-05-10 IMAGING — DX DG CHEST 1V PORT
1 series · 1 of 1 positions shown · non-contrast
Comparison: 10/08/2019

CLINICAL DATA: COVID pneumonia

EXAM:
PORTABLE CHEST 1 VIEW

[chest ap]
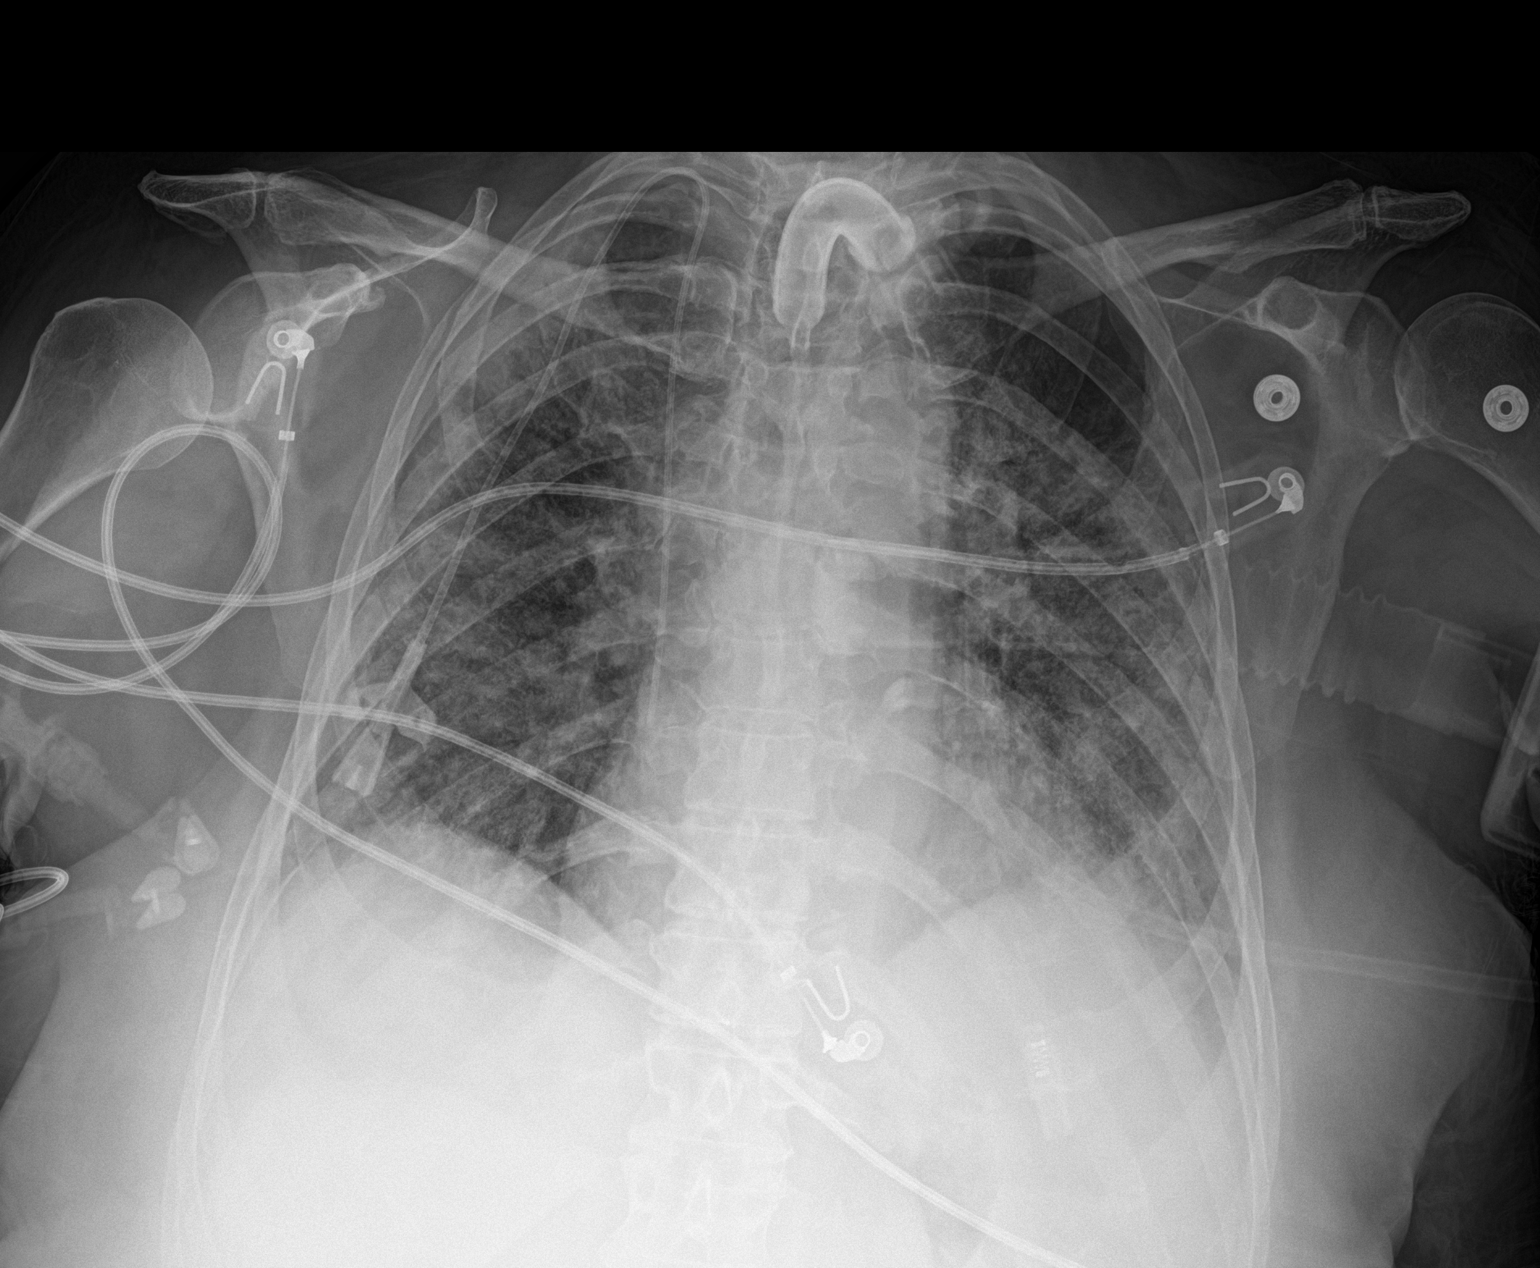

[1 of 1 positions shown; findings below may reference images not displayed]

FINDINGS: The tracheostomy tube is in good position, unchanged.

Right IJ central venous catheter in good position, unchanged.

The cardiac silhouette, mediastinal and hilar contours are within
normal limits and stable.

Persistent interstitial thickening and patchy airspace infiltrates
in both lungs. No pleural effusion or pneumothorax.
IMPRESSION: Persistent interstitial thickening and patchy airspace infiltrates.

## 2022-05-14 IMAGING — DX DG CHEST 1V PORT
1 series · 1 of 1 positions shown · non-contrast
Comparison: 10/10/2019

CLINICAL DATA: Pneumonia

EXAM:
PORTABLE CHEST 1 VIEW

[chest ap]
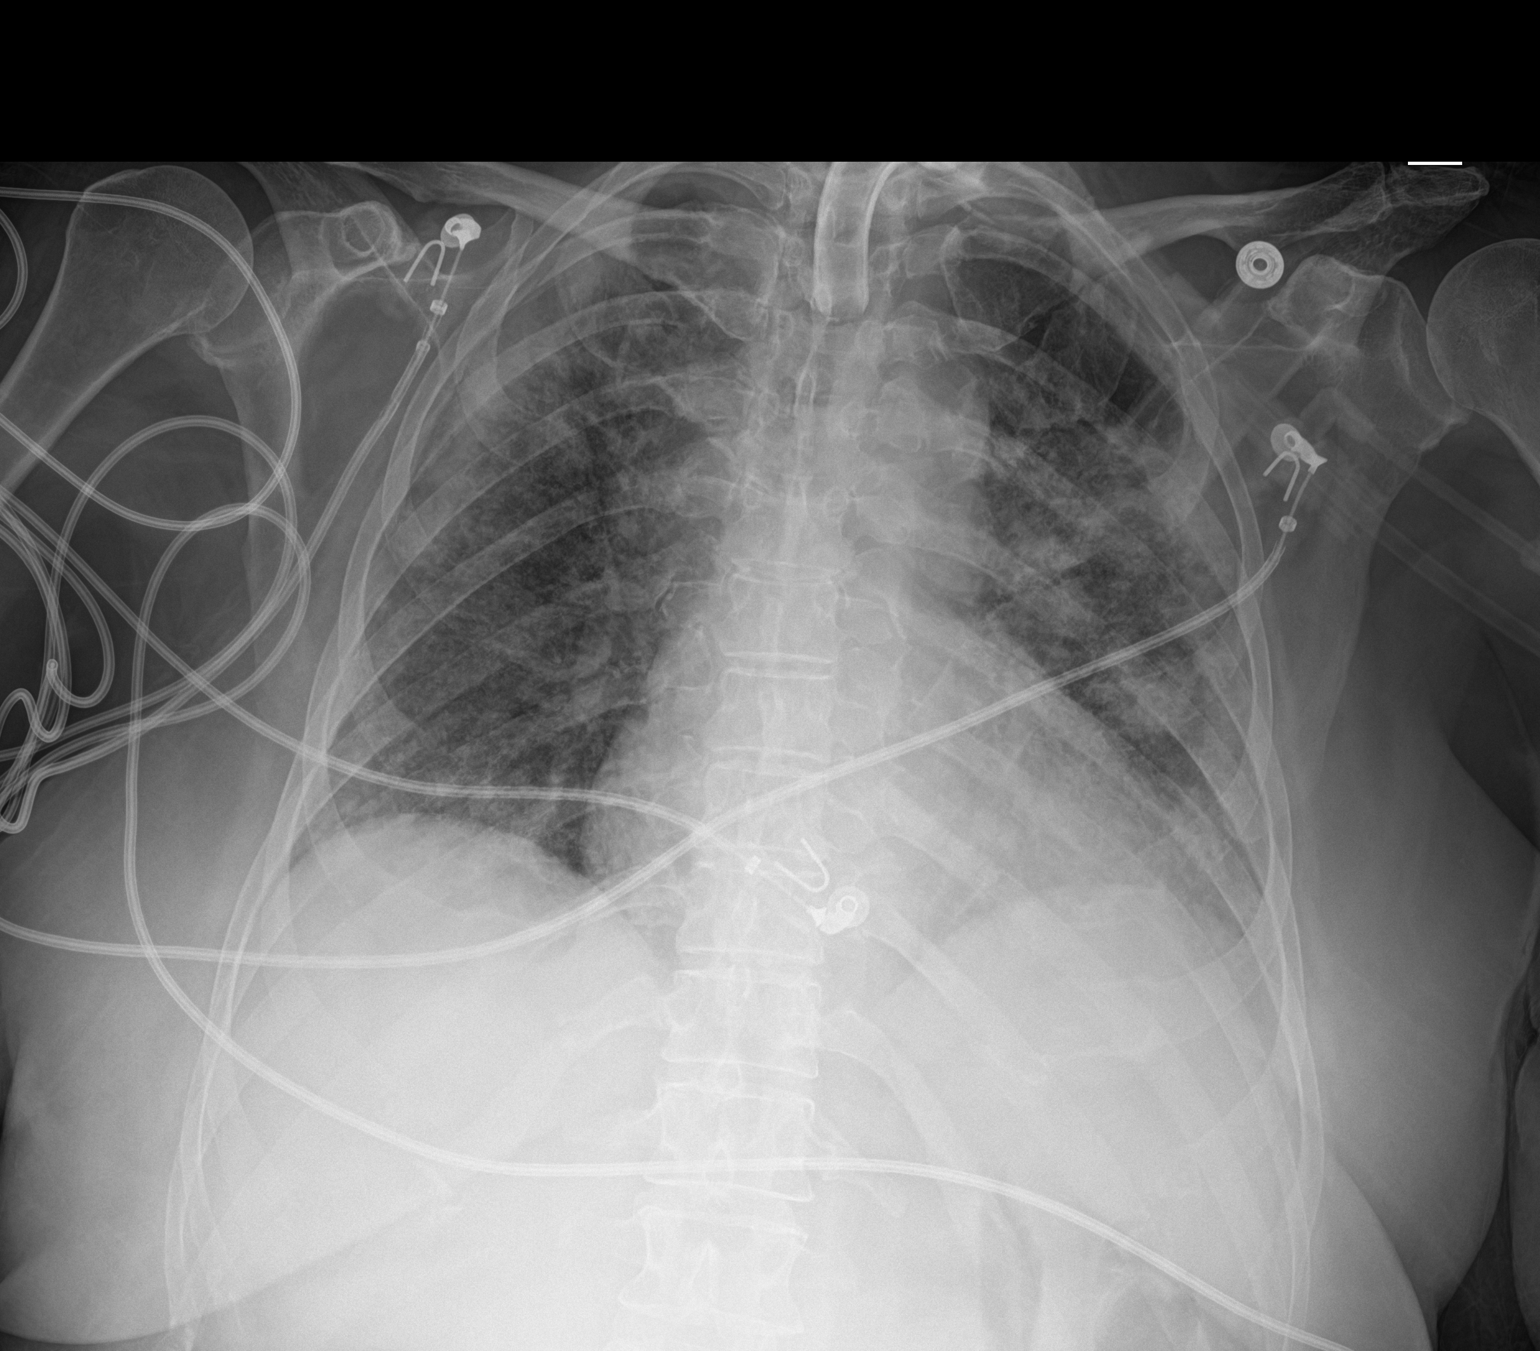

[1 of 1 positions shown; findings below may reference images not displayed]

FINDINGS: Un tracheostomy is unchanged. Right internal jugular central venous
catheter has been removed.

Pulmonary insufflation is stable and symmetric. Superimposed diffuse
airspace infiltrate, slightly more severe within the left lung,
appears stable since prior examination. No pneumothorax or pleural
effusion. Cardiac size within normal limits. Pulmonary vascularity
is normal.
IMPRESSION: Stable diffuse, slightly asymmetric pulmonary infiltrate.

## 2022-05-25 IMAGING — DX DG CHEST 1V PORT
1 series · 1 of 1 positions shown · non-contrast
Comparison: 10/14/2019

CLINICAL DATA: In patient with pneumonia

EXAM:
PORTABLE CHEST 1 VIEW

[chest ap]
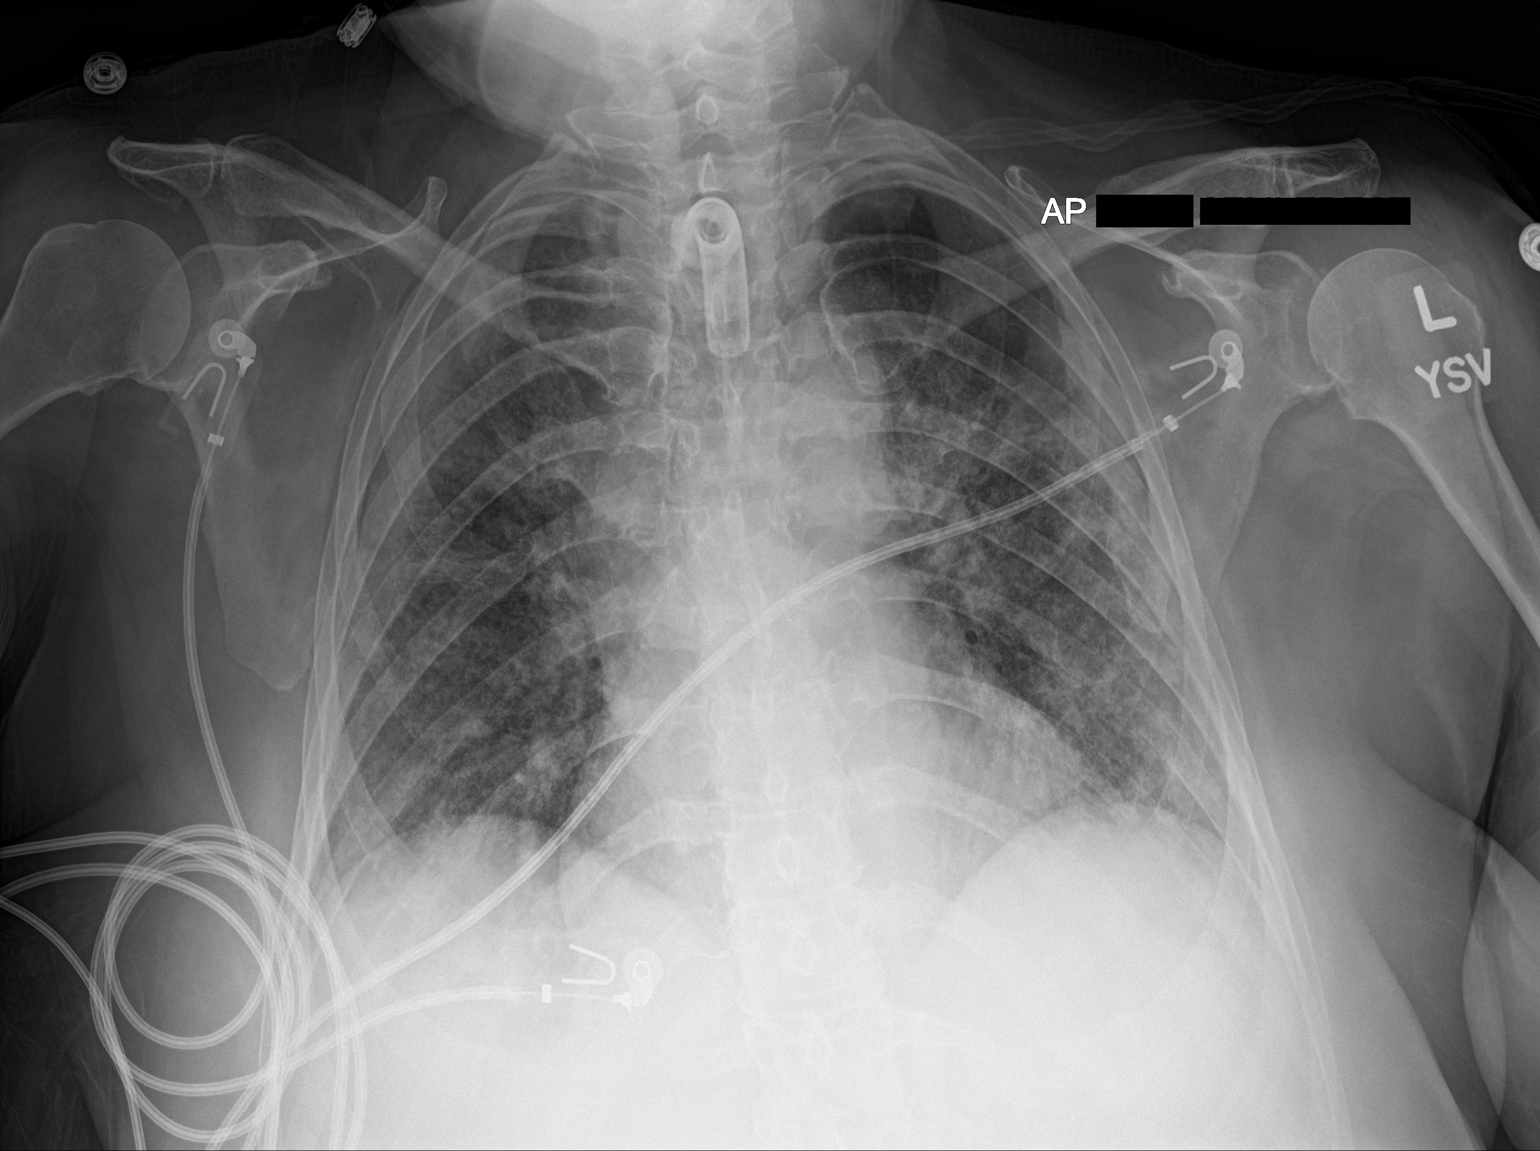

[1 of 1 positions shown; findings below may reference images not displayed]

FINDINGS: Cardiomegaly. Patchy interstitial and airspace opacity which is
unchanged. Tracheostomy tube in place. No visible effusion or
pneumothorax.
IMPRESSION: Stable infiltrates and inflation.
